# Patient Record
Sex: Female | Born: 1975 | Race: White | Hispanic: No | Marital: Married | Smoking: Never smoker
Health system: Southern US, Community
[De-identification: ages and names within clinical notes are randomized; demographics above are authoritative.]

## PROBLEM LIST (undated history)

## (undated) DIAGNOSIS — Z789 Other specified health status: Secondary | ICD-10-CM

## (undated) DIAGNOSIS — O09529 Supervision of elderly multigravida, unspecified trimester: Secondary | ICD-10-CM

## (undated) HISTORY — DX: Supervision of elderly multigravida, unspecified trimester: O09.529

## (undated) HISTORY — DX: Other specified health status: Z78.9

## (undated) HISTORY — PX: NO PAST SURGERIES: SHX2092

---

## 2001-11-20 ENCOUNTER — Other Ambulatory Visit: Admission: RE | Admit: 2001-11-20 | Discharge: 2001-11-20 | Payer: Self-pay | Admitting: Gynecology

## 2004-01-01 ENCOUNTER — Other Ambulatory Visit: Admission: RE | Admit: 2004-01-01 | Discharge: 2004-01-01 | Payer: Self-pay | Admitting: Gynecology

## 2004-10-19 ENCOUNTER — Other Ambulatory Visit: Admission: RE | Admit: 2004-10-19 | Discharge: 2004-10-19 | Payer: Self-pay | Admitting: Gynecology

## 2005-01-12 ENCOUNTER — Other Ambulatory Visit: Admission: RE | Admit: 2005-01-12 | Discharge: 2005-01-12 | Payer: Self-pay | Admitting: Gynecology

## 2005-09-14 ENCOUNTER — Other Ambulatory Visit: Admission: RE | Admit: 2005-09-14 | Discharge: 2005-09-14 | Payer: Self-pay | Admitting: Gynecology

## 2006-01-17 ENCOUNTER — Other Ambulatory Visit: Admission: RE | Admit: 2006-01-17 | Discharge: 2006-01-17 | Payer: Self-pay | Admitting: Gynecology

## 2006-04-25 ENCOUNTER — Ambulatory Visit (HOSPITAL_COMMUNITY): Admission: RE | Admit: 2006-04-25 | Discharge: 2006-04-25 | Payer: Self-pay | Admitting: Gynecology

## 2006-04-25 ENCOUNTER — Encounter (INDEPENDENT_AMBULATORY_CARE_PROVIDER_SITE_OTHER): Payer: Self-pay | Admitting: Specialist

## 2006-10-10 ENCOUNTER — Ambulatory Visit (HOSPITAL_COMMUNITY): Admission: RE | Admit: 2006-10-10 | Discharge: 2006-10-10 | Payer: Self-pay | Admitting: Obstetrics and Gynecology

## 2006-10-10 ENCOUNTER — Encounter (INDEPENDENT_AMBULATORY_CARE_PROVIDER_SITE_OTHER): Payer: Self-pay | Admitting: *Deleted

## 2007-10-06 ENCOUNTER — Inpatient Hospital Stay (HOSPITAL_COMMUNITY): Admission: AD | Admit: 2007-10-06 | Discharge: 2007-10-07 | Payer: Self-pay | Admitting: Obstetrics and Gynecology

## 2007-10-07 ENCOUNTER — Inpatient Hospital Stay (HOSPITAL_COMMUNITY): Admission: AD | Admit: 2007-10-07 | Discharge: 2007-10-10 | Payer: Self-pay | Admitting: Obstetrics & Gynecology

## 2009-02-06 ENCOUNTER — Ambulatory Visit (HOSPITAL_COMMUNITY): Admission: RE | Admit: 2009-02-06 | Discharge: 2009-02-06 | Payer: Self-pay | Admitting: Obstetrics and Gynecology

## 2009-07-07 ENCOUNTER — Inpatient Hospital Stay (HOSPITAL_COMMUNITY): Admission: RE | Admit: 2009-07-07 | Discharge: 2009-07-09 | Payer: Self-pay | Admitting: Obstetrics and Gynecology

## 2010-10-19 LAB — CBC
HCT: 34 % — ABNORMAL LOW (ref 36.0–46.0)
Hemoglobin: 11.5 g/dL — ABNORMAL LOW (ref 12.0–15.0)
Hemoglobin: 8.9 g/dL — ABNORMAL LOW (ref 12.0–15.0)
MCHC: 33.8 g/dL (ref 30.0–36.0)
MCHC: 34.2 g/dL (ref 30.0–36.0)
MCV: 93.8 fL (ref 78.0–100.0)
Platelets: 101 10*3/uL — ABNORMAL LOW (ref 150–400)
RBC: 2.36 MIL/uL — ABNORMAL LOW (ref 3.87–5.11)
RBC: 2.77 MIL/uL — ABNORMAL LOW (ref 3.87–5.11)
WBC: 5.6 10*3/uL (ref 4.0–10.5)

## 2010-12-04 NOTE — Consult Note (Signed)
NAMESEANA, Margaret Sutton            ACCOUNT NO.:  [id number]   MEDICAL RECORD NO.:  [id number]          PATIENT TYPE:  AMB   LOCATION:  SDC                           FACILITY:  [facility]   PHYSICIAN:  Juan H. Lily Peer, M.D.DATE OF BIRTH:  1976-07-17   DATE OF CONSULTATION:  04/25/2006  DATE OF DISCHARGE:                                   CONSULTATION   INDICATIONS FOR OPERATION:  A 35 year old gravida 2 para 0, AB1, with a  first-trimester first AB.   PREOPERATIVE DIAGNOSIS:  First-trimester missed abortion.   POSTOPERATIVE DIAGNOSIS:  First-trimester missed abortion.   ANESTHESIA:  MAC anesthesia and paracervical block.   FINDINGS:  Uterus approximately 8-10 weeks size, anteverted.  No palpable  adnexal masses.   DESCRIPTION OF OPERATION:  After the patient was adequately counseled, she  was taken to the operating room, where she underwent intravenous sedation.  Her legs were placed in a high lithotomy position.  The vagina and perineum  were prepped and draped in the usual sterile fashion.  A Foley catheter was  inserted in an effort to empty the bladder of its contents for approximately  50 cc.  The patient had received 1 g of cefoxitin for prophylaxis  preoperatively.  Her blood type is O positive.  Two percent Xylocaine with  1:100,000 epinephrine was inserted into the cervicovaginal stroma at the 2,  4, 8, and 10 o'clock positions for a total of 10 cc.  The cervix was dilated  to allow an 8 mm suction curette to be introduced to evacuate the uterus of  its contents, as well as with a Hunter's curette to completely evacuate the  uterus cavity of products of conception.  The single-tooth tenaculum was  removed.  The patient was awakened and transferred to the recovery room with  stable vital signs.  Blood loss was minimal, and fluid resuscitation  consisted of 1000 cc of lactated Ringer's.  She will receive 30 mg of  Toradol on arrival to the recovery room.      Juan H.  Lily Peer, M.D.  Electronically Signed     JHF/MEDQ  D:  04/25/2006  T:  04/25/2006  Job:  [id number]

## 2010-12-04 NOTE — H&P (Signed)
Margaret Sutton, Margaret Sutton            ACCOUNT NO.:  [id number]   MEDICAL RECORD NO.:  [id number]          PATIENT TYPE:  AMB   LOCATION:  SDC                           [facility]  [facility]   PHYSICIAN:  Juan H. Lily Peer, M.D.DATE OF BIRTH:  [birth year]   DATE OF ADMISSION:  04/25/2006  DATE OF DISCHARGE:                                HISTORY & PHYSICAL   CHIEF COMPLAINT:  First trimester missed abortion.   HISTORY OF PRESENT ILLNESS:  The patient is a 35 year old gravida 2, para 0,  abortion 1 who has been followed in the office secondary to a missed  abortion, based on uncertain last menstrual period.  She has not had any  bleeding or cramping.  Her quantitative beta HCG's had been followed closely  along with ultrasound and the ultrasound today continued to demonstrate that  based on her ultrasound measurements she would be six weeks and two days and  by maternal age by dates would be eight weeks and six days.  Still, there  was only evidence of a gestational sac but a questionable fetal pole and no  heart motion noted.  The growth of gestational sac, yolk sac was noted to be  eight weeks and two days.  No fetal heart seen with questionable fetal pole  at the edge of the sac, no change within the ovaries.  This is consistent  with a missed abortion.  Her quantitative beta HCG's were as follows.  On  October 2, the value was 116,[phone].  On October 4 it was 117,817.  The patient  was given the options to wait and see if she passes the products of  conception on her own or to proceed with a D&E.  They have offered to  proceed with a D&E after she was counseled in the office.  The patient  denies any allergies.  Denies any smoking or alcohol consumption.  No  previous surgery reported.   PHYSICAL EXAMINATION:  VITAL SIGNS:  The patient is approximately 138  pounds.  HEENT:  Unremarkable.  NECK:  Supple.  Trachea midline.  No carotid bruits, no thyromegaly.  LUNGS:  Clear to auscultation  without rhonchi or wheezes.  HEART:  Regular rate and rhythm.  No murmurs or gallops.  BREASTS:  Not done.  ABDOMEN:  Soft, nontender, without rebound or guarding.  PELVIC:  Vulva and urethra within normal limits.  Vaginal and cervix:  No  lesions or discharge.  Uterus approximately 8 weeks size.  No palpable  masses or tenderness.  RECTAL EXAM:  Not done.   ASSESSMENT:  A 35 year old gravida 2, para 0, abortion 1 with evidence of a  missed abortion.  The patient is scheduled to undergo a D&E at [facility] this afternoon.  The risks and benefits, pro's and con's were  discussed.  All questions were answered.  Will follow accordingly.   PLAN:  The patient is scheduled for D&E at 1 p.m. at [facility].      [place] H. Lily Peer, M.D.  Electronically Signed    JHF/MEDQ  D:  04/25/2006  T:  04/25/2006  Job:  [id number] 

## 2010-12-04 NOTE — Op Note (Signed)
Margaret Sutton, Margaret Sutton NO.:  [id number]   MEDICAL RECORD NO.:  [id number]          PATIENT TYPE:  AMB   LOCATION:  SDC                           [facility]ACILITY:  [facility]   PHYSICIAN:  Zelphia Cairo, MD    DATE OF BIRTH:  [birth year]   DATE OF PROCEDURE:  10/10/2006  DATE OF DISCHARGE:                               OPERATIVE REPORT   PREOPERATIVE DIAGNOSIS:  Missed abortion.   POSTOPERATIVE DIAGNOSIS:  Missed abortion.   PROCEDURE:  Suction D&E.   SURGEON:  Dr. Renaldo Fiddler   ASSISTANT:  None.   ANESTHESIA:  MAC.   SPECIMEN:  Products of conception.   ESTIMATED BLOOD LOSS:  50 mL.   COMPLICATIONS:  None.   CONDITION:  Stable to recovery room.   INDICATIONS:  The patient was seen in our office last week for a new OB  appointment at which time it was noted by ultrasound that she had twin  gestation with fetal demise of both fetuses.  She had a prior ultrasound  performed 3-4 weeks prior documenting a single intrauterine pregnancy  with cardiac motion.  Ultrasound results and options were discussed with  the patient.  The patient elected to proceed with suction D&E.  Risks,  benefits, and alternatives were discussed with the patient, and informed  consent was obtained.   PROCEDURE:  The patient was taken to the operating room where MAC  anesthesia was found to be adequate.  She was placed in the dorsal  lithotomy position using Allen stirrups.  She was prepped and draped in  sterile fashion, and her bladder was drained for approximately 75 mL of  clear urine.  A bivalve speculum was placed in the vagina, and a single-  tooth tenaculum was placed on the anterior lip of the cervix.  A  cervical block was performed using 1% Nesacaine.  The cervix was easily  dilated using Pratt dilators, and a 7-French suction catheter was used  to remove products of conception.  A gentle curetting was then performed  to ensure a uterine cry.  The suction catheter was  then reinserted  to remove all clots and debris.  The single-tooth  tenaculum was removed from the cervix.  Hemostasis was assured.  Speculum was removed from the vagina, and the patient was taken to the  recovery room in stable condition.      Zelphia Cairo, MD  Electronically Signed     GA/MEDQ  D:  10/10/2006  T:  10/10/2006  Job:  [id number]

## 2011-04-12 LAB — CBC
HCT: 33.3 — ABNORMAL LOW
Hemoglobin: 10.9 — ABNORMAL LOW
Hemoglobin: 8.3 — ABNORMAL LOW
MCHC: 35.2
MCV: 90.7
Platelets: 122 — ABNORMAL LOW
Platelets: 125 — ABNORMAL LOW
RBC: 2.55 — ABNORMAL LOW
RBC: 3.4 — ABNORMAL LOW
WBC: 6.6
WBC: 7.2
WBC: 7.5
WBC: 8.1

## 2011-04-12 LAB — FIBRINOGEN: Fibrinogen: 486 — ABNORMAL HIGH

## 2011-04-12 LAB — RPR: RPR Ser Ql: NONREACTIVE

## 2011-04-12 LAB — PLATELET COUNT: Platelets: 121 — ABNORMAL LOW

## 2011-07-30 ENCOUNTER — Ambulatory Visit: Payer: BC Managed Care – PPO

## 2011-07-30 DIAGNOSIS — J019 Acute sinusitis, unspecified: Secondary | ICD-10-CM

## 2015-02-21 ENCOUNTER — Other Ambulatory Visit: Payer: Self-pay | Admitting: Obstetrics and Gynecology

## 2015-02-24 LAB — CYTOLOGY - PAP

## 2016-07-21 LAB — OB RESULTS CONSOLE ANTIBODY SCREEN: Antibody Screen: NEGATIVE

## 2016-07-21 LAB — OB RESULTS CONSOLE RUBELLA ANTIBODY, IGM: RUBELLA: IMMUNE

## 2016-07-21 LAB — OB RESULTS CONSOLE ABO/RH: RH Type: POSITIVE

## 2016-07-21 LAB — OB RESULTS CONSOLE GC/CHLAMYDIA
CHLAMYDIA, DNA PROBE: NEGATIVE
GC PROBE AMP, GENITAL: NEGATIVE

## 2016-07-21 LAB — OB RESULTS CONSOLE HEPATITIS B SURFACE ANTIGEN: Hepatitis B Surface Ag: NEGATIVE

## 2016-07-21 LAB — OB RESULTS CONSOLE RPR: RPR: NONREACTIVE

## 2016-07-21 LAB — OB RESULTS CONSOLE HIV ANTIBODY (ROUTINE TESTING): HIV: NONREACTIVE

## 2017-02-10 ENCOUNTER — Telehealth (HOSPITAL_COMMUNITY): Payer: Self-pay | Admitting: *Deleted

## 2017-02-10 ENCOUNTER — Encounter (HOSPITAL_COMMUNITY): Payer: Self-pay | Admitting: *Deleted

## 2017-02-13 LAB — OB RESULTS CONSOLE GBS: STREP GROUP B AG: NEGATIVE

## 2017-02-14 ENCOUNTER — Encounter (HOSPITAL_COMMUNITY): Payer: Self-pay | Admitting: *Deleted

## 2017-02-14 NOTE — Telephone Encounter (Signed)
Preadmission screen  

## 2017-02-23 NOTE — H&P (Signed)
Margaret Sutton is a 41 y.o. female presenting for IOL.  Pt w/ h/o abruption during her first pregnancy - delivered at 35 wks.  This pregnancy uncomplicated.   OB History    Gravida Para Term Preterm AB Living   5 2 1 1 2 2    SAB TAB Ectopic Multiple Live Births   2       2     Past Medical History:  Diagnosis Date  . AMA (advanced maternal age) multigravida 35+   . Medical history non-contributory    Past Surgical History:  Procedure Laterality Date  . NO PAST SURGERIES     Family History: family history includes Prostate cancer in her father. Social History:  reports that she has never smoked. She has never used smokeless tobacco. She reports that she does not drink alcohol or use drugs.     Maternal Diabetes: No Genetic Screening: Normal Maternal Ultrasounds/Referrals: Normal Fetal Ultrasounds or other Referrals:  None Maternal Substance Abuse:  No Significant Maternal Medications:  None Significant Maternal Lab Results:  None Other Comments:  None  ROS History   Last menstrual period 05/26/2016. Exam Physical Exam  Prenatal labs: ABO, Rh: O/Positive/-- (01/03 0000) Antibody: Negative (01/03 0000) Rubella: Immune (01/03 0000) RPR: Nonreactive (01/03 0000)  HBsAg: Negative (01/03 0000)  HIV: Non-reactive (01/03 0000)  GBS:   negative  Assessment/Plan: Admit Pitocin/AROM Epidural prn   Margaret Sutton 02/23/2017, 1:08 PM

## 2017-02-24 ENCOUNTER — Inpatient Hospital Stay (HOSPITAL_COMMUNITY)
Admission: RE | Admit: 2017-02-24 | Discharge: 2017-02-26 | DRG: 775 | Disposition: A | Discharge status: Home / Self Care | Payer: BLUE CROSS/BLUE SHIELD | Source: Ambulatory Visit | Attending: Obstetrics and Gynecology | Admitting: Obstetrics and Gynecology

## 2017-02-24 ENCOUNTER — Inpatient Hospital Stay (HOSPITAL_COMMUNITY): Payer: BLUE CROSS/BLUE SHIELD | Admitting: Anesthesiology

## 2017-02-24 ENCOUNTER — Encounter (HOSPITAL_COMMUNITY): Payer: Self-pay

## 2017-02-24 VITALS — BP 105/51 | HR 79 | Temp 97.7°F | Resp 16 | Ht 66.0 in | Wt 195.0 lb

## 2017-02-24 DIAGNOSIS — Z3493 Encounter for supervision of normal pregnancy, unspecified, third trimester: Secondary | ICD-10-CM

## 2017-02-24 DIAGNOSIS — Z3A39 39 weeks gestation of pregnancy: Secondary | ICD-10-CM | POA: Diagnosis not present

## 2017-02-24 DIAGNOSIS — O9902 Anemia complicating childbirth: Principal | ICD-10-CM | POA: Diagnosis present

## 2017-02-24 DIAGNOSIS — O99214 Obesity complicating childbirth: Secondary | ICD-10-CM | POA: Diagnosis present

## 2017-02-24 DIAGNOSIS — O26893 Other specified pregnancy related conditions, third trimester: Secondary | ICD-10-CM | POA: Diagnosis present

## 2017-02-24 DIAGNOSIS — Z6831 Body mass index (BMI) 31.0-31.9, adult: Secondary | ICD-10-CM | POA: Diagnosis not present

## 2017-02-24 DIAGNOSIS — E669 Obesity, unspecified: Secondary | ICD-10-CM | POA: Diagnosis present

## 2017-02-24 DIAGNOSIS — Z3490 Encounter for supervision of normal pregnancy, unspecified, unspecified trimester: Secondary | ICD-10-CM

## 2017-02-24 DIAGNOSIS — D649 Anemia, unspecified: Secondary | ICD-10-CM | POA: Diagnosis present

## 2017-02-24 LAB — CBC
HCT: 35.8 % — ABNORMAL LOW (ref 36.0–46.0)
HEMATOCRIT: 34.1 % — AB (ref 36.0–46.0)
HEMOGLOBIN: 11.4 g/dL — AB (ref 12.0–15.0)
HEMOGLOBIN: 11.9 g/dL — AB (ref 12.0–15.0)
MCH: 30.2 pg (ref 26.0–34.0)
MCH: 30.1 pg (ref 26.0–34.0)
MCHC: 33.4 g/dL (ref 30.0–36.0)
MCHC: 33.2 g/dL (ref 30.0–36.0)
MCV: 90.2 fL (ref 78.0–100.0)
MCV: 90.6 fL (ref 78.0–100.0)
PLATELETS: 140 10*3/uL — AB (ref 150–400)
PLATELETS: 128 10*3/uL — AB (ref 150–400)
RBC: 3.78 MIL/uL — AB (ref 3.87–5.11)
RBC: 3.95 MIL/uL (ref 3.87–5.11)
RDW: 13.8 % (ref 11.5–15.5)
RDW: 13.7 % (ref 11.5–15.5)
WBC: 6.6 10*3/uL (ref 4.0–10.5)
WBC: 11.2 10*3/uL — ABNORMAL HIGH (ref 4.0–10.5)

## 2017-02-24 LAB — TYPE AND SCREEN
ABO/RH(D): O POS
Antibody Screen: NEGATIVE

## 2017-02-24 LAB — RPR: RPR Ser Ql: NONREACTIVE

## 2017-02-24 MED ORDER — SOD CITRATE-CITRIC ACID 500-334 MG/5ML PO SOLN: 30.0000 mL | ORAL | Status: DC | PRN

## 2017-02-24 MED ORDER — PHENYLEPHRINE 40 MCG/ML (10ML) SYRINGE FOR IV PUSH (FOR BLOOD PRESSURE SUPPORT)
80.0000 ug | PREFILLED_SYRINGE | INTRAVENOUS | Status: DC | PRN
  Filled 2017-02-24: qty 5

## 2017-02-24 MED ORDER — SENNOSIDES-DOCUSATE SODIUM 8.6-50 MG PO TABS
2.0000 | ORAL_TABLET | ORAL | Status: DC
  Administered 2017-02-24 – 2017-02-25 (×2): 2 via ORAL
  Filled 2017-02-24 (×2): qty 2

## 2017-02-24 MED ORDER — MEASLES, MUMPS & RUBELLA VAC ~~LOC~~ INJ: 0.5000 mL | INJECTION | Freq: Once | SUBCUTANEOUS | Status: DC

## 2017-02-24 MED ORDER — TETANUS-DIPHTH-ACELL PERTUSSIS 5-2.5-18.5 LF-MCG/0.5 IM SUSP: 0.5000 mL | Freq: Once | INTRAMUSCULAR | Status: DC

## 2017-02-24 MED ORDER — ONDANSETRON HCL 4 MG PO TABS: 4.0000 mg | ORAL_TABLET | ORAL | Status: DC | PRN

## 2017-02-24 MED ORDER — ONDANSETRON HCL 4 MG/2ML IJ SOLN: 4.0000 mg | Freq: Four times a day (QID) | INTRAMUSCULAR | Status: DC | PRN

## 2017-02-24 MED ORDER — LACTATED RINGERS IV SOLN
INTRAVENOUS | Status: DC
  Administered 2017-02-24 (×3): via INTRAVENOUS

## 2017-02-24 MED ORDER — METHYLERGONOVINE MALEATE 0.2 MG PO TABS
0.2000 mg | ORAL_TABLET | Freq: Three times a day (TID) | ORAL | Status: DC
  Administered 2017-02-24 – 2017-02-25 (×4): 0.2 mg via ORAL
  Filled 2017-02-24 (×4): qty 1

## 2017-02-24 MED ORDER — FENTANYL 2.5 MCG/ML BUPIVACAINE 1/10 % EPIDURAL INFUSION (WH - ANES)
14.0000 mL/h | INTRAMUSCULAR | Status: DC | PRN
  Administered 2017-02-24: 14 mL/h via EPIDURAL
  Filled 2017-02-24: qty 100

## 2017-02-24 MED ORDER — ONDANSETRON HCL 4 MG/2ML IJ SOLN: 4.0000 mg | INTRAMUSCULAR | Status: DC | PRN

## 2017-02-24 MED ORDER — OXYTOCIN 40 UNITS IN LACTATED RINGERS INFUSION - SIMPLE MED: 2.5000 [IU]/h | INTRAVENOUS | Status: DC

## 2017-02-24 MED ORDER — DIPHENHYDRAMINE HCL 25 MG PO CAPS: 25.0000 mg | ORAL_CAPSULE | Freq: Four times a day (QID) | ORAL | Status: DC | PRN

## 2017-02-24 MED ORDER — ACETAMINOPHEN 325 MG PO TABS
650.0000 mg | ORAL_TABLET | ORAL | Status: DC | PRN
  Administered 2017-02-25: 650 mg via ORAL
  Filled 2017-02-24: qty 2

## 2017-02-24 MED ORDER — SIMETHICONE 80 MG PO CHEW: 80.0000 mg | CHEWABLE_TABLET | ORAL | Status: DC | PRN

## 2017-02-24 MED ORDER — EPHEDRINE 5 MG/ML INJ
10.0000 mg | INTRAVENOUS | Status: DC | PRN
  Filled 2017-02-24: qty 2

## 2017-02-24 MED ORDER — LIDOCAINE HCL (PF) 1 % IJ SOLN
30.0000 mL | INTRAMUSCULAR | Status: DC | PRN
  Filled 2017-02-24: qty 30

## 2017-02-24 MED ORDER — PRENATAL MULTIVITAMIN CH
1.0000 | ORAL_TABLET | Freq: Every day | ORAL | Status: DC
  Administered 2017-02-25: 1 via ORAL
  Filled 2017-02-24: qty 1

## 2017-02-24 MED ORDER — OXYCODONE-ACETAMINOPHEN 5-325 MG PO TABS: 1.0000 | ORAL_TABLET | ORAL | Status: DC | PRN

## 2017-02-24 MED ORDER — MEDROXYPROGESTERONE ACETATE 150 MG/ML IM SUSP: 150.0000 mg | INTRAMUSCULAR | Status: DC | PRN

## 2017-02-24 MED ORDER — ZOLPIDEM TARTRATE 5 MG PO TABS: 5.0000 mg | ORAL_TABLET | Freq: Every evening | ORAL | Status: DC | PRN

## 2017-02-24 MED ORDER — LACTATED RINGERS IV SOLN: 500.0000 mL | INTRAVENOUS | Status: DC | PRN

## 2017-02-24 MED ORDER — LIDOCAINE HCL (PF) 1 % IJ SOLN
INTRAMUSCULAR | Status: DC | PRN
  Administered 2017-02-24: 2 mL via EPIDURAL
  Administered 2017-02-24: 3 mL via EPIDURAL
  Administered 2017-02-24: 5 mL via EPIDURAL

## 2017-02-24 MED ORDER — OXYCODONE-ACETAMINOPHEN 5-325 MG PO TABS: 2.0000 | ORAL_TABLET | ORAL | Status: DC | PRN

## 2017-02-24 MED ORDER — WITCH HAZEL-GLYCERIN EX PADS: 1.0000 "application " | MEDICATED_PAD | CUTANEOUS | Status: DC | PRN

## 2017-02-24 MED ORDER — IBUPROFEN 600 MG PO TABS
600.0000 mg | ORAL_TABLET | Freq: Four times a day (QID) | ORAL | Status: DC
  Administered 2017-02-24 – 2017-02-26 (×7): 600 mg via ORAL
  Filled 2017-02-24 (×7): qty 1

## 2017-02-24 MED ORDER — LACTATED RINGERS IV SOLN: 500.0000 mL | Freq: Once | INTRAVENOUS | Status: DC

## 2017-02-24 MED ORDER — OXYTOCIN BOLUS FROM INFUSION: 500.0000 mL | Freq: Once | INTRAVENOUS | Status: DC

## 2017-02-24 MED ORDER — ACETAMINOPHEN 325 MG PO TABS
650.0000 mg | ORAL_TABLET | ORAL | Status: DC | PRN
  Administered 2017-02-24: 650 mg via ORAL
  Filled 2017-02-24: qty 2

## 2017-02-24 MED ORDER — OXYTOCIN 40 UNITS IN LACTATED RINGERS INFUSION - SIMPLE MED
1.0000 m[IU]/min | INTRAVENOUS | Status: DC
  Administered 2017-02-24: 2 m[IU]/min via INTRAVENOUS
  Filled 2017-02-24: qty 1000

## 2017-02-24 MED ORDER — SODIUM CHLORIDE 0.9 % IV SOLN
2.0000 g | Freq: Four times a day (QID) | INTRAVENOUS | Status: AC
  Administered 2017-02-24 – 2017-02-25 (×3): 2 g via INTRAVENOUS
  Filled 2017-02-24 (×3): qty 2000

## 2017-02-24 MED ORDER — DIBUCAINE 1 % RE OINT: 1.0000 "application " | TOPICAL_OINTMENT | RECTAL | Status: DC | PRN

## 2017-02-24 MED ORDER — METHYLERGONOVINE MALEATE 0.2 MG/ML IJ SOLN
INTRAMUSCULAR | Status: AC
  Filled 2017-02-24: qty 1

## 2017-02-24 MED ORDER — COCONUT OIL OIL: 1.0000 "application " | TOPICAL_OIL | Status: DC | PRN

## 2017-02-24 MED ORDER — DIPHENHYDRAMINE HCL 50 MG/ML IJ SOLN: 12.5000 mg | INTRAMUSCULAR | Status: DC | PRN

## 2017-02-24 MED ORDER — PHENYLEPHRINE 40 MCG/ML (10ML) SYRINGE FOR IV PUSH (FOR BLOOD PRESSURE SUPPORT)
80.0000 ug | PREFILLED_SYRINGE | INTRAVENOUS | Status: DC | PRN
  Filled 2017-02-24: qty 10
  Filled 2017-02-24: qty 5

## 2017-02-24 MED ORDER — BENZOCAINE-MENTHOL 20-0.5 % EX AERO
1.0000 "application " | INHALATION_SPRAY | CUTANEOUS | Status: DC | PRN
  Administered 2017-02-24: 1 via TOPICAL
  Filled 2017-02-24: qty 56

## 2017-02-24 MED ORDER — METHYLERGONOVINE MALEATE 0.2 MG/ML IJ SOLN
0.2000 mg | Freq: Once | INTRAMUSCULAR | Status: AC
  Administered 2017-02-24: 0.2 mg via INTRAMUSCULAR

## 2017-02-24 MED ORDER — TERBUTALINE SULFATE 1 MG/ML IJ SOLN
0.2500 mg | Freq: Once | INTRAMUSCULAR | Status: DC | PRN
Start: 2017-02-24 — End: 2017-02-24
  Filled 2017-02-24: qty 1

## 2017-02-24 NOTE — Progress Notes (Signed)
SVD of vigorous female infant w/ apgars of 8,9.  Umbilical cord severed when mom and RN pulled baby to chest and quickly clamped. Placenta was tightly adherent to right fundus and manually removed in segments Pt tolerated well and with the final sweep of uterus, I did not palpate any retained POC 2nd degree lac repaired w/ 3-0 vicryl rapide. Methergine given Fundus firm.  EBL 300cc .

## 2017-02-24 NOTE — Progress Notes (Signed)
Pt comfortable w/ epidural  FHT cat 1 Toco Q2 Cvx 4-5 per last RN exam  A/P:  Exp mngt

## 2017-02-24 NOTE — Anesthesia Preprocedure Evaluation (Signed)
Anesthesia Evaluation  Patient identified by MRN, date of birth, ID band Patient awake    Reviewed: Allergy & Precautions, NPO status , Patient's Chart, lab work & pertinent test results  Airway Mallampati: II  TM Distance: >3 FB Neck ROM: Full    Dental  (+) Teeth Intact, Dental Advisory Given   Pulmonary neg pulmonary ROS,    Pulmonary exam normal breath sounds clear to auscultation       Cardiovascular negative cardio ROS Normal cardiovascular exam Rhythm:Regular Rate:Normal     Neuro/Psych negative neurological ROS  negative psych ROS   GI/Hepatic negative GI ROS, Neg liver ROS,   Endo/Other  Obesity   Renal/GU negative Renal ROS     Musculoskeletal negative musculoskeletal ROS (+)   Abdominal   Peds  Hematology  (+) Blood dyscrasia, anemia , thrombocytopenia--plt 140k   Anesthesia Other Findings Day of surgery medications reviewed with the patient.  Reproductive/Obstetrics Advanced maternal age                             Anesthesia Physical Anesthesia Plan  ASA: II  Anesthesia Plan: Epidural   Post-op Pain Management:    Induction:   PONV Risk Score and Plan: Treatment may vary due to age or medical condition  Airway Management Planned:   Additional Equipment:   Intra-op Plan:   Post-operative Plan:   Informed Consent: I have reviewed the patients History and Physical, chart, labs and discussed the procedure including the risks, benefits and alternatives for the proposed anesthesia with the patient or authorized representative who has indicated his/her understanding and acceptance.   Dental advisory given  Plan Discussed with:   Anesthesia Plan Comments: (Patient identified. Risks/Benefits/Options discussed with patient including but not limited to bleeding, infection, nerve damage, paralysis, failed block, incomplete pain control, headache, blood pressure changes,  nausea, vomiting, reactions to medication both or allergic, itching and postpartum back pain. Confirmed with bedside nurse the patient's most recent platelet count. Confirmed with patient that they are not currently taking any anticoagulation, have any bleeding history or any family history of bleeding disorders. Patient expressed understanding and wished to proceed. All questions were answered. )        Anesthesia Quick Evaluation

## 2017-02-24 NOTE — Anesthesia Pain Management Evaluation Note (Signed)
  CRNA Pain Management Visit Note  Patient: Margaret Sutton, 41 y.o., female  "Hello I am a [facility] of the anesthesia team at [facility]. We have an anesthesia team available at all times to provide care throughout the hospital, including epidural management and anesthesia for C-section. I don't know your plan for the delivery whether it a natural birth, water birth, IV sedation, nitrous supplementation, doula or epidural, but we want to meet your pain goals."   1.Was your pain managed to your expectations on prior hospitalizations?   Yes   2.What is your expectation for pain management during this hospitalization?     Epidural  3.How can we help you reach that goal? Epidural when desired.  Record the patient's initial score and the patient's pain goal.   Pain: 0  Pain Goal: 5 [facility] wants you to be able to say your pain was always managed very well.  Chloee Tena 02/24/2017

## 2017-02-24 NOTE — Anesthesia Procedure Notes (Signed)
Epidural Patient location during procedure: OB Start time: 02/24/2017 10:54 AM End time: 02/24/2017 10:59 AM  Staffing Anesthesiologist: Cecile HearingURK, Lonn Im EDWARD Performed: anesthesiologist   Preanesthetic Checklist Completed: patient identified, pre-op evaluation, timeout performed, IV checked, risks and benefits discussed and monitors and equipment checked  Epidural Patient position: sitting Prep: DuraPrep Patient monitoring: blood pressure and continuous pulse ox Approach: midline Location: L3-L4 Injection technique: LOR air  Needle:  Needle type: Tuohy  Needle gauge: 17 G Needle length: 9 cm Needle insertion depth: 5 cm Catheter size: 19 Gauge Catheter at skin depth: 10 cm Test dose: negative and Other (1% Lidocaine)  Additional Notes Patient identified.  Risk benefits discussed including failed block, incomplete pain control, headache, nerve damage, paralysis, blood pressure changes, nausea, vomiting, reactions to medication both toxic or allergic, and postpartum back pain.  Patient expressed understanding and wished to proceed.  All questions were answered.  Sterile technique used throughout procedure and epidural site dressed with sterile barrier dressing. No paresthesia or other complications noted. The patient did not experience any signs of intravascular injection such as tinnitus or metallic taste in mouth nor signs of intrathecal spread such as rapid motor block. Please see nursing notes for vital signs. Reason for block:procedure for pain

## 2017-02-24 NOTE — Lactation Note (Signed)
This note was copied from a baby's chart. Lactation Consultation Note  Patient Name: Boy Melody Havershley Stailey WUJWJ'XToday's Date: 02/24/2017 Reason for consult: Initial assessment   Baby 4 hours old.  P3.  Mother states she bf and pumped for a few weeks and gave up because she doubted her milk supply. She is happy baby latched for approx 40 min after birth.  Baby is sleeping. Offered to unwrap baby and assist w/ feeding but parents did not want to wake baby at this time. Recommend waking baby within the next hour, place baby STS and attempt feeding. Discussed hand expression and spoon feeding, supply and demand. Mom encouraged to feed baby 8-12 times/24 hours and with feeding cues.  Mom made aware of O/P services, breastfeeding support groups, community resources, and our phone # for post-discharge questions.     Maternal Data Has patient been taught Hand Expression?: Yes (per mom) Does the patient have breastfeeding experience prior to this delivery?: Yes  Feeding    LATCH Score                   Interventions    Lactation Tools Discussed/Used     Consult Status Consult Status: Follow-up Date: 02/25/17 Follow-up type: In-patient    Dahlia ByesBerkelhammer, Valory Wetherby [facility] 02/24/2017, 9:39 PM

## 2017-02-24 NOTE — Progress Notes (Signed)
Pt w/out complaints.  No ctx or vb.  AF, VSS Abd - gravid, NT  EFW 7.5# Ext - NT Cvx 2/50/-2 AROM - clear  A/P:  Start pitocin Exp mngt Epidural prn

## 2017-02-24 NOTE — Progress Notes (Signed)
5 big sponges 5 small sponges 2 injectables 10 instruments

## 2017-02-24 NOTE — Anesthesia Postprocedure Evaluation (Signed)
Anesthesia Post Note  Patient: Margaret Sutton  Procedure(s) Performed: * No procedures listed *     Patient location during evaluation: Mother Baby Anesthesia Type: Epidural Level of consciousness: awake and alert and oriented Pain management: satisfactory to patient Vital Signs Assessment: post-procedure vital signs reviewed and stable Respiratory status: spontaneous breathing and nonlabored ventilation Cardiovascular status: stable Postop Assessment: no headache, no backache, no signs of nausea or vomiting, adequate PO intake and patient able to bend at knees (patient up walking) Anesthetic complications: no    Last Vitals:  Vitals:   02/24/17 1800 02/24/17 1900  BP: (!) 115/59 123/68  Pulse: 71 81  Resp: 16 18  Temp: 37 C 36.7 C  SpO2:      Last Pain:  Vitals:   02/24/17 1900  TempSrc: Oral   Pain Goal:                 Madison HickmanGREGORY,Leslie Langille

## 2017-02-25 LAB — CBC
HCT: 32.3 % — ABNORMAL LOW (ref 36.0–46.0)
Hemoglobin: 10.8 g/dL — ABNORMAL LOW (ref 12.0–15.0)
MCH: 30.4 pg (ref 26.0–34.0)
MCHC: 33.4 g/dL (ref 30.0–36.0)
MCV: 91 fL (ref 78.0–100.0)
PLATELETS: 127 10*3/uL — AB (ref 150–400)
RBC: 3.55 MIL/uL — AB (ref 3.87–5.11)
RDW: 13.9 % (ref 11.5–15.5)
WBC: 8.3 10*3/uL (ref 4.0–10.5)

## 2017-02-25 NOTE — Progress Notes (Signed)
Post Partum Day 1 Subjective: no complaints, up ad lib, voiding, tolerating PO and + flatus  Objective: Blood pressure (!) 111/59, pulse 66, temperature 97.9 F (36.6 C), temperature source Oral, resp. rate 16, height 5\' 6"  (1.676 m), weight 195 lb (88.5 kg), last menstrual period 05/26/2016, SpO2 100 %, unknown if currently breastfeeding.  Physical Exam:  General: alert, cooperative and no distress Lochia: appropriate Uterine Fundus: firm Incision: healing well DVT Evaluation: No evidence of DVT seen on physical exam.   Recent Labs  02/24/17 1642 02/25/17 0548  HGB 11.9* 10.8*  HCT 35.8* 32.3*    Assessment/Plan: Plan for discharge tomorrow   LOS: 1 day   Adrianne Shackleton II,Rami Waddle E 02/25/2017, 8:24 AM

## 2017-02-26 MED ORDER — IBUPROFEN 600 MG PO TABS: 600.0000 mg | ORAL_TABLET | Freq: Four times a day (QID) | ORAL | 0 refills | Status: AC | PRN

## 2017-02-26 NOTE — Discharge Summary (Signed)
Obstetric Discharge Summary Reason for Admission: induction of labor Prenatal Procedures: none Intrapartum Procedures: spontaneous vaginal delivery Postpartum Procedures: none Complications-Operative and Postpartum: none Hemoglobin  Date Value Ref Range Status  02/25/2017 10.8 (L) 12.0 - 15.0 g/dL Final   HCT  Date Value Ref Range Status  02/25/2017 32.3 (L) 36.0 - 46.0 % Final    Physical Exam:  General: alert, cooperative and no distress Lochia: appropriate Uterine Fundus: firm Incision: healing well DVT Evaluation: No evidence of DVT seen on physical exam.  Discharge Diagnoses: Term Pregnancy-delivered  Discharge Information: Date: 02/26/2017 Activity: pelvic rest Diet: routine Medications: PNV and Ibuprofen Condition: stable Instructions: refer to practice specific booklet Discharge to: home   Newborn Data: Live born female  Birth Weight: 7 lb 13 oz (3545 g) APGAR: 8, 9  Home with mother.  Margaret Sutton,Margaret Sutton E 02/26/2017, 8:00 AM

## 2017-02-26 NOTE — Lactation Note (Signed)
This note was copied from a baby's chart. Lactation Consultation Note  Patient Name: Margaret Sutton WUJWJ'XToday's Date: 02/26/2017  Mom states baby was frustrated last night and would not sustain latch so she started supplementing with alimentum.  Discussed milk coming to volume.  Reviewed supplementation parameters.  Mom has a pump at home.  Instructed to pump breasts anytime baby receives supplement.  Reviewed OP services and support and encouraged to call prn.   Maternal Data    Feeding    LATCH Score                   Interventions    Lactation Tools Discussed/Used     Consult Status      Huston FoleyMOULDEN, Nycere Presley S 02/26/2017, 9:09 AM

## 2019-08-09 DIAGNOSIS — U071 COVID-19: Secondary | ICD-10-CM | POA: Diagnosis not present

## 2020-03-13 DIAGNOSIS — F331 Major depressive disorder, recurrent, moderate: Secondary | ICD-10-CM | POA: Diagnosis not present

## 2020-03-13 DIAGNOSIS — F411 Generalized anxiety disorder: Secondary | ICD-10-CM | POA: Diagnosis not present

## 2020-03-13 DIAGNOSIS — G4700 Insomnia, unspecified: Secondary | ICD-10-CM | POA: Diagnosis not present

## 2020-04-22 DIAGNOSIS — Z0000 Encounter for general adult medical examination without abnormal findings: Secondary | ICD-10-CM | POA: Diagnosis not present

## 2020-04-22 DIAGNOSIS — Z1159 Encounter for screening for other viral diseases: Secondary | ICD-10-CM | POA: Diagnosis not present

## 2020-04-24 DIAGNOSIS — F331 Major depressive disorder, recurrent, moderate: Secondary | ICD-10-CM | POA: Diagnosis not present

## 2020-04-24 DIAGNOSIS — Z7185 Encounter for immunization safety counseling: Secondary | ICD-10-CM | POA: Diagnosis not present

## 2020-04-24 DIAGNOSIS — G4700 Insomnia, unspecified: Secondary | ICD-10-CM | POA: Diagnosis not present

## 2020-04-24 DIAGNOSIS — F411 Generalized anxiety disorder: Secondary | ICD-10-CM | POA: Diagnosis not present

## 2020-05-07 DIAGNOSIS — Z0000 Encounter for general adult medical examination without abnormal findings: Secondary | ICD-10-CM | POA: Diagnosis not present

## 2020-05-07 DIAGNOSIS — Z23 Encounter for immunization: Secondary | ICD-10-CM | POA: Diagnosis not present

## 2020-05-21 DIAGNOSIS — Z1231 Encounter for screening mammogram for malignant neoplasm of breast: Secondary | ICD-10-CM | POA: Diagnosis not present

## 2020-05-21 DIAGNOSIS — Z6831 Body mass index (BMI) 31.0-31.9, adult: Secondary | ICD-10-CM | POA: Diagnosis not present

## 2020-05-21 DIAGNOSIS — Z01419 Encounter for gynecological examination (general) (routine) without abnormal findings: Secondary | ICD-10-CM | POA: Diagnosis not present

## 2020-05-27 ENCOUNTER — Other Ambulatory Visit: Payer: Self-pay | Admitting: Obstetrics and Gynecology

## 2020-05-27 DIAGNOSIS — R928 Other abnormal and inconclusive findings on diagnostic imaging of breast: Secondary | ICD-10-CM

## 2020-06-06 ENCOUNTER — Ambulatory Visit
Admission: RE | Admit: 2020-06-06 | Discharge: 2020-06-06 | Disposition: A | Discharge status: Home / Self Care | Payer: BLUE CROSS/BLUE SHIELD | Source: Ambulatory Visit | Attending: Obstetrics and Gynecology | Admitting: Obstetrics and Gynecology

## 2020-06-06 ENCOUNTER — Ambulatory Visit
Admission: RE | Admit: 2020-06-06 | Discharge: 2020-06-06 | Disposition: A | Discharge status: Home / Self Care | Payer: BC Managed Care – PPO | Source: Ambulatory Visit | Attending: Obstetrics and Gynecology | Admitting: Obstetrics and Gynecology

## 2020-06-06 ENCOUNTER — Other Ambulatory Visit: Payer: Self-pay | Admitting: Obstetrics and Gynecology

## 2020-06-06 ENCOUNTER — Other Ambulatory Visit: Payer: Self-pay

## 2020-06-06 DIAGNOSIS — R922 Inconclusive mammogram: Secondary | ICD-10-CM | POA: Diagnosis not present

## 2020-06-06 DIAGNOSIS — R928 Other abnormal and inconclusive findings on diagnostic imaging of breast: Secondary | ICD-10-CM

## 2020-06-06 DIAGNOSIS — N6489 Other specified disorders of breast: Secondary | ICD-10-CM | POA: Diagnosis not present

## 2020-06-19 ENCOUNTER — Other Ambulatory Visit: Payer: Self-pay

## 2020-06-19 ENCOUNTER — Ambulatory Visit
Admission: RE | Admit: 2020-06-19 | Discharge: 2020-06-19 | Disposition: A | Discharge status: Home / Self Care | Payer: BC Managed Care – PPO | Source: Ambulatory Visit | Attending: Obstetrics and Gynecology | Admitting: Obstetrics and Gynecology

## 2020-06-19 DIAGNOSIS — N6312 Unspecified lump in the right breast, upper inner quadrant: Secondary | ICD-10-CM | POA: Diagnosis not present

## 2020-06-19 DIAGNOSIS — N62 Hypertrophy of breast: Secondary | ICD-10-CM | POA: Diagnosis not present

## 2020-06-19 DIAGNOSIS — R928 Other abnormal and inconclusive findings on diagnostic imaging of breast: Secondary | ICD-10-CM

## 2020-07-30 DIAGNOSIS — Z1152 Encounter for screening for COVID-19: Secondary | ICD-10-CM | POA: Diagnosis not present

## 2020-11-10 DIAGNOSIS — Z23 Encounter for immunization: Secondary | ICD-10-CM | POA: Diagnosis not present

## 2020-11-25 DIAGNOSIS — J019 Acute sinusitis, unspecified: Secondary | ICD-10-CM | POA: Diagnosis not present

## 2021-05-05 DIAGNOSIS — M62838 Other muscle spasm: Secondary | ICD-10-CM | POA: Diagnosis not present

## 2021-05-05 DIAGNOSIS — N393 Stress incontinence (female) (male): Secondary | ICD-10-CM | POA: Diagnosis not present

## 2021-05-05 DIAGNOSIS — M6281 Muscle weakness (generalized): Secondary | ICD-10-CM | POA: Diagnosis not present

## 2021-05-05 DIAGNOSIS — K5900 Constipation, unspecified: Secondary | ICD-10-CM | POA: Diagnosis not present

## 2021-05-05 DIAGNOSIS — R197 Diarrhea, unspecified: Secondary | ICD-10-CM | POA: Diagnosis not present

## 2021-05-05 DIAGNOSIS — R3915 Urgency of urination: Secondary | ICD-10-CM | POA: Diagnosis not present

## 2021-05-19 DIAGNOSIS — R3915 Urgency of urination: Secondary | ICD-10-CM | POA: Diagnosis not present

## 2021-05-19 DIAGNOSIS — M6281 Muscle weakness (generalized): Secondary | ICD-10-CM | POA: Diagnosis not present

## 2021-05-19 DIAGNOSIS — N393 Stress incontinence (female) (male): Secondary | ICD-10-CM | POA: Diagnosis not present

## 2021-05-19 DIAGNOSIS — R197 Diarrhea, unspecified: Secondary | ICD-10-CM | POA: Diagnosis not present

## 2021-05-19 DIAGNOSIS — K5900 Constipation, unspecified: Secondary | ICD-10-CM | POA: Diagnosis not present

## 2021-05-19 DIAGNOSIS — M62838 Other muscle spasm: Secondary | ICD-10-CM | POA: Diagnosis not present

## 2021-05-26 DIAGNOSIS — R197 Diarrhea, unspecified: Secondary | ICD-10-CM | POA: Diagnosis not present

## 2021-05-26 DIAGNOSIS — M62838 Other muscle spasm: Secondary | ICD-10-CM | POA: Diagnosis not present

## 2021-05-26 DIAGNOSIS — K5900 Constipation, unspecified: Secondary | ICD-10-CM | POA: Diagnosis not present

## 2021-05-26 DIAGNOSIS — N393 Stress incontinence (female) (male): Secondary | ICD-10-CM | POA: Diagnosis not present

## 2021-05-26 DIAGNOSIS — R3915 Urgency of urination: Secondary | ICD-10-CM | POA: Diagnosis not present

## 2021-05-26 DIAGNOSIS — M6281 Muscle weakness (generalized): Secondary | ICD-10-CM | POA: Diagnosis not present

## 2021-06-02 DIAGNOSIS — R3915 Urgency of urination: Secondary | ICD-10-CM | POA: Diagnosis not present

## 2021-06-02 DIAGNOSIS — R197 Diarrhea, unspecified: Secondary | ICD-10-CM | POA: Diagnosis not present

## 2021-06-02 DIAGNOSIS — M6281 Muscle weakness (generalized): Secondary | ICD-10-CM | POA: Diagnosis not present

## 2021-06-02 DIAGNOSIS — K5900 Constipation, unspecified: Secondary | ICD-10-CM | POA: Diagnosis not present

## 2021-06-02 DIAGNOSIS — M62838 Other muscle spasm: Secondary | ICD-10-CM | POA: Diagnosis not present

## 2021-06-02 DIAGNOSIS — N393 Stress incontinence (female) (male): Secondary | ICD-10-CM | POA: Diagnosis not present

## 2021-06-09 DIAGNOSIS — N393 Stress incontinence (female) (male): Secondary | ICD-10-CM | POA: Diagnosis not present

## 2021-06-09 DIAGNOSIS — R197 Diarrhea, unspecified: Secondary | ICD-10-CM | POA: Diagnosis not present

## 2021-06-09 DIAGNOSIS — K5900 Constipation, unspecified: Secondary | ICD-10-CM | POA: Diagnosis not present

## 2021-06-09 DIAGNOSIS — M6281 Muscle weakness (generalized): Secondary | ICD-10-CM | POA: Diagnosis not present

## 2021-06-09 DIAGNOSIS — M62838 Other muscle spasm: Secondary | ICD-10-CM | POA: Diagnosis not present

## 2021-06-09 DIAGNOSIS — R3915 Urgency of urination: Secondary | ICD-10-CM | POA: Diagnosis not present

## 2021-06-16 DIAGNOSIS — F411 Generalized anxiety disorder: Secondary | ICD-10-CM | POA: Diagnosis not present

## 2021-06-16 DIAGNOSIS — K5900 Constipation, unspecified: Secondary | ICD-10-CM | POA: Diagnosis not present

## 2021-06-16 DIAGNOSIS — N393 Stress incontinence (female) (male): Secondary | ICD-10-CM | POA: Diagnosis not present

## 2021-06-16 DIAGNOSIS — R197 Diarrhea, unspecified: Secondary | ICD-10-CM | POA: Diagnosis not present

## 2021-06-16 DIAGNOSIS — M62838 Other muscle spasm: Secondary | ICD-10-CM | POA: Diagnosis not present

## 2021-06-16 DIAGNOSIS — G4700 Insomnia, unspecified: Secondary | ICD-10-CM | POA: Diagnosis not present

## 2021-06-16 DIAGNOSIS — M6281 Muscle weakness (generalized): Secondary | ICD-10-CM | POA: Diagnosis not present

## 2021-06-16 DIAGNOSIS — F331 Major depressive disorder, recurrent, moderate: Secondary | ICD-10-CM | POA: Diagnosis not present

## 2021-06-16 DIAGNOSIS — R3915 Urgency of urination: Secondary | ICD-10-CM | POA: Diagnosis not present

## 2021-06-23 DIAGNOSIS — K5900 Constipation, unspecified: Secondary | ICD-10-CM | POA: Diagnosis not present

## 2021-06-23 DIAGNOSIS — N393 Stress incontinence (female) (male): Secondary | ICD-10-CM | POA: Diagnosis not present

## 2021-06-23 DIAGNOSIS — M62838 Other muscle spasm: Secondary | ICD-10-CM | POA: Diagnosis not present

## 2021-06-23 DIAGNOSIS — M6281 Muscle weakness (generalized): Secondary | ICD-10-CM | POA: Diagnosis not present

## 2021-06-30 DIAGNOSIS — M6281 Muscle weakness (generalized): Secondary | ICD-10-CM | POA: Diagnosis not present

## 2021-06-30 DIAGNOSIS — N393 Stress incontinence (female) (male): Secondary | ICD-10-CM | POA: Diagnosis not present

## 2021-06-30 DIAGNOSIS — K5900 Constipation, unspecified: Secondary | ICD-10-CM | POA: Diagnosis not present

## 2021-06-30 DIAGNOSIS — M62838 Other muscle spasm: Secondary | ICD-10-CM | POA: Diagnosis not present

## 2021-07-07 DIAGNOSIS — K5900 Constipation, unspecified: Secondary | ICD-10-CM | POA: Diagnosis not present

## 2021-07-07 DIAGNOSIS — N393 Stress incontinence (female) (male): Secondary | ICD-10-CM | POA: Diagnosis not present

## 2021-07-07 DIAGNOSIS — M6281 Muscle weakness (generalized): Secondary | ICD-10-CM | POA: Diagnosis not present

## 2021-07-07 DIAGNOSIS — M62838 Other muscle spasm: Secondary | ICD-10-CM | POA: Diagnosis not present

## 2021-07-22 DIAGNOSIS — Z1329 Encounter for screening for other suspected endocrine disorder: Secondary | ICD-10-CM | POA: Diagnosis not present

## 2021-07-22 DIAGNOSIS — E785 Hyperlipidemia, unspecified: Secondary | ICD-10-CM | POA: Diagnosis not present

## 2021-07-22 DIAGNOSIS — Z131 Encounter for screening for diabetes mellitus: Secondary | ICD-10-CM | POA: Diagnosis not present

## 2021-07-22 DIAGNOSIS — Z1231 Encounter for screening mammogram for malignant neoplasm of breast: Secondary | ICD-10-CM | POA: Diagnosis not present

## 2021-07-22 DIAGNOSIS — Z6829 Body mass index (BMI) 29.0-29.9, adult: Secondary | ICD-10-CM | POA: Diagnosis not present

## 2021-07-22 DIAGNOSIS — Z01419 Encounter for gynecological examination (general) (routine) without abnormal findings: Secondary | ICD-10-CM | POA: Diagnosis not present

## 2021-07-30 DIAGNOSIS — E782 Mixed hyperlipidemia: Secondary | ICD-10-CM | POA: Diagnosis not present

## 2021-07-30 DIAGNOSIS — F411 Generalized anxiety disorder: Secondary | ICD-10-CM | POA: Diagnosis not present

## 2021-07-30 DIAGNOSIS — F331 Major depressive disorder, recurrent, moderate: Secondary | ICD-10-CM | POA: Diagnosis not present

## 2021-07-30 DIAGNOSIS — G4700 Insomnia, unspecified: Secondary | ICD-10-CM | POA: Diagnosis not present

## 2021-08-25 DIAGNOSIS — M2022 Hallux rigidus, left foot: Secondary | ICD-10-CM | POA: Diagnosis not present

## 2021-09-03 DIAGNOSIS — E559 Vitamin D deficiency, unspecified: Secondary | ICD-10-CM | POA: Diagnosis not present

## 2021-09-16 DIAGNOSIS — F331 Major depressive disorder, recurrent, moderate: Secondary | ICD-10-CM | POA: Diagnosis not present

## 2021-09-16 DIAGNOSIS — G4700 Insomnia, unspecified: Secondary | ICD-10-CM | POA: Diagnosis not present

## 2021-09-16 DIAGNOSIS — F411 Generalized anxiety disorder: Secondary | ICD-10-CM | POA: Diagnosis not present

## 2021-09-16 DIAGNOSIS — E559 Vitamin D deficiency, unspecified: Secondary | ICD-10-CM | POA: Diagnosis not present

## 2022-03-08 IMAGING — US US BREAST*L* LIMITED INC AXILLA
1 series · 5 of 5 positions shown · non-contrast
Comparison: Previous exam(s).

CLINICAL DATA: Patient was recalled from screening mammogram for
possible asymmetries in both breast.

EXAM:
DIGITAL DIAGNOSTIC BILATERAL MAMMOGRAM WITH CAD AND TOMO
ULTRASOUND BILATERAL BREAST

[Series 1: us breast*left* limited inc axilla · 0.06mm/px · 5 of 5 slices shown]
[im 1/5]
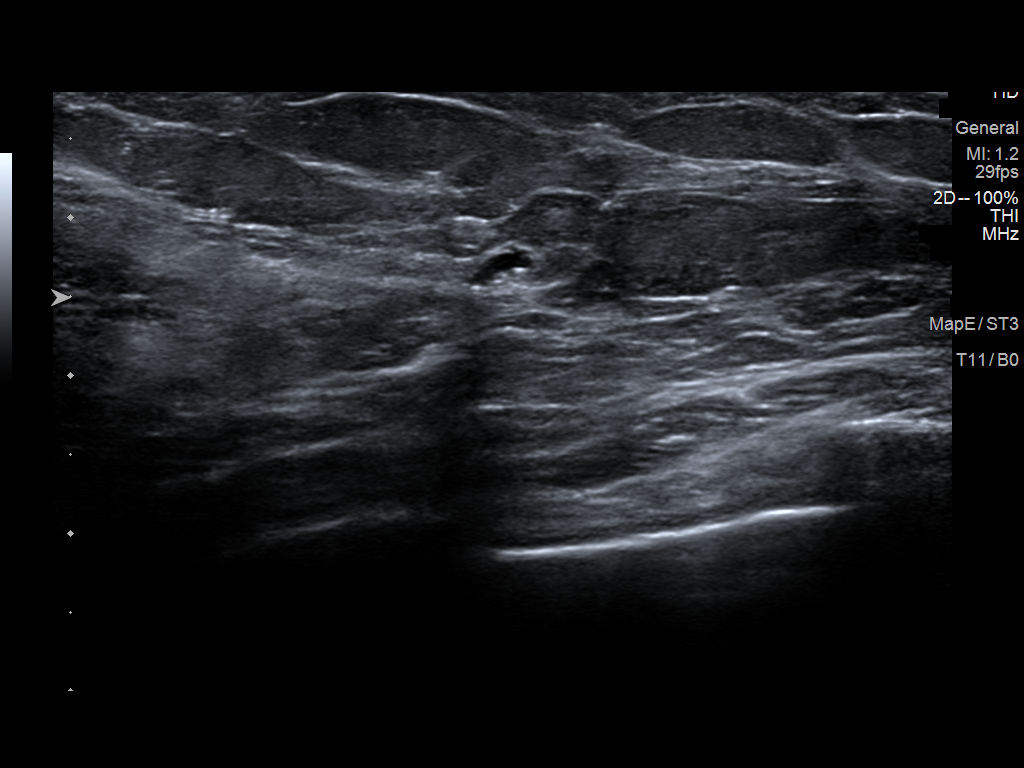
[im 2/5]
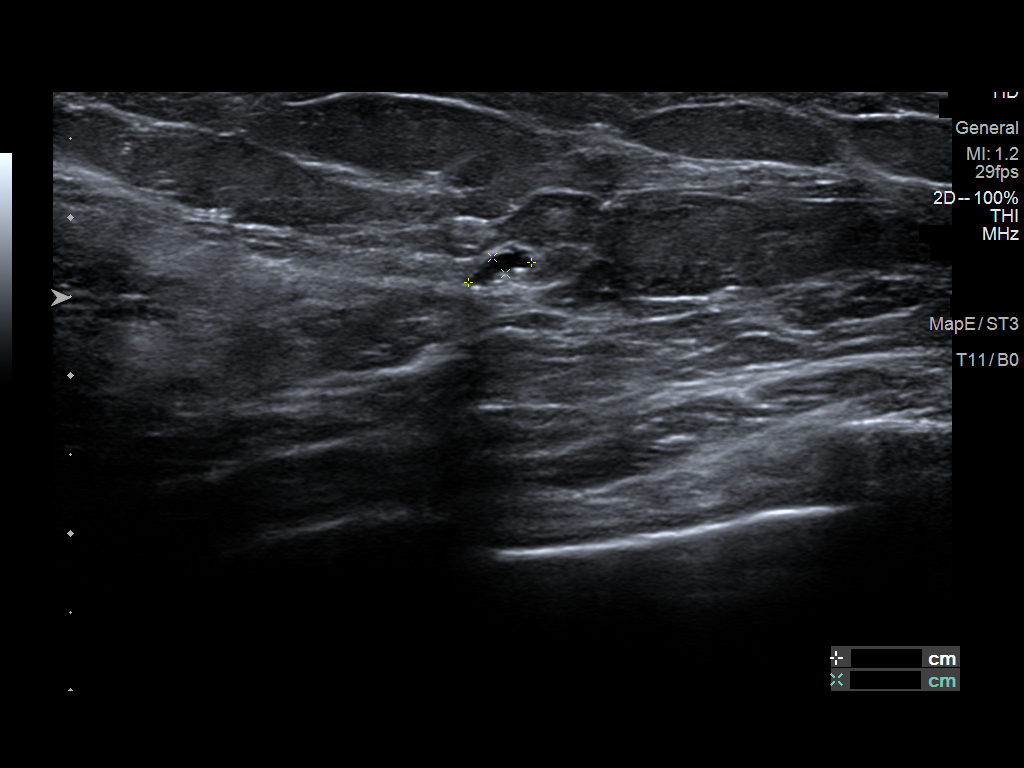
[im 3/5]
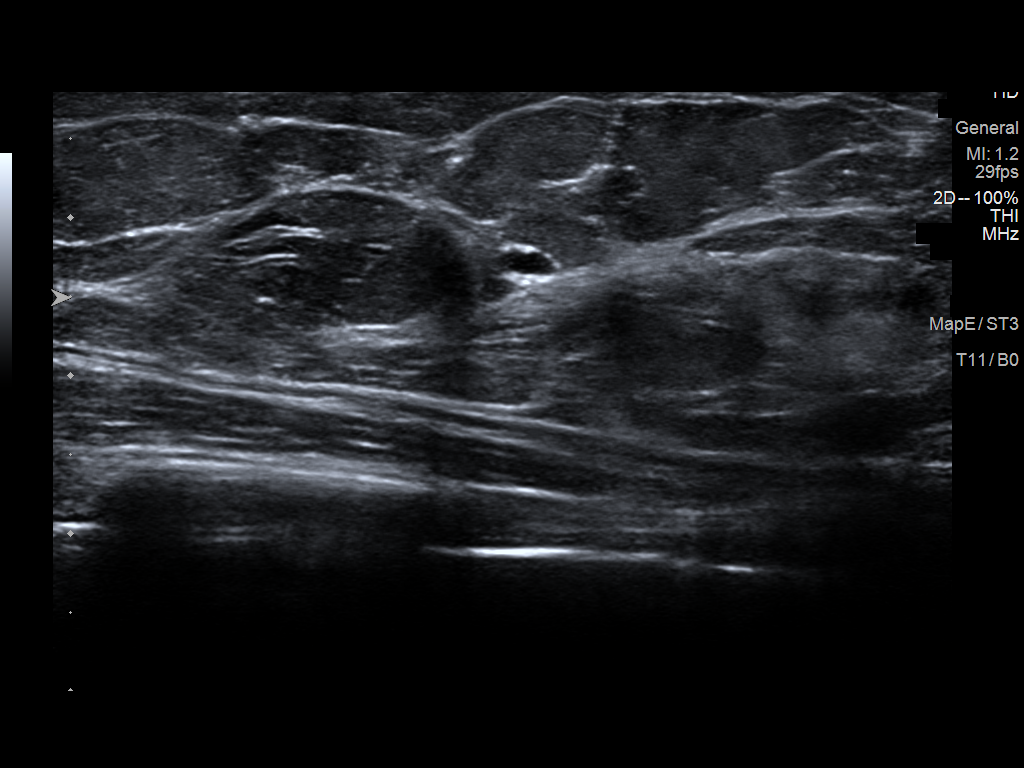
[im 4/5]
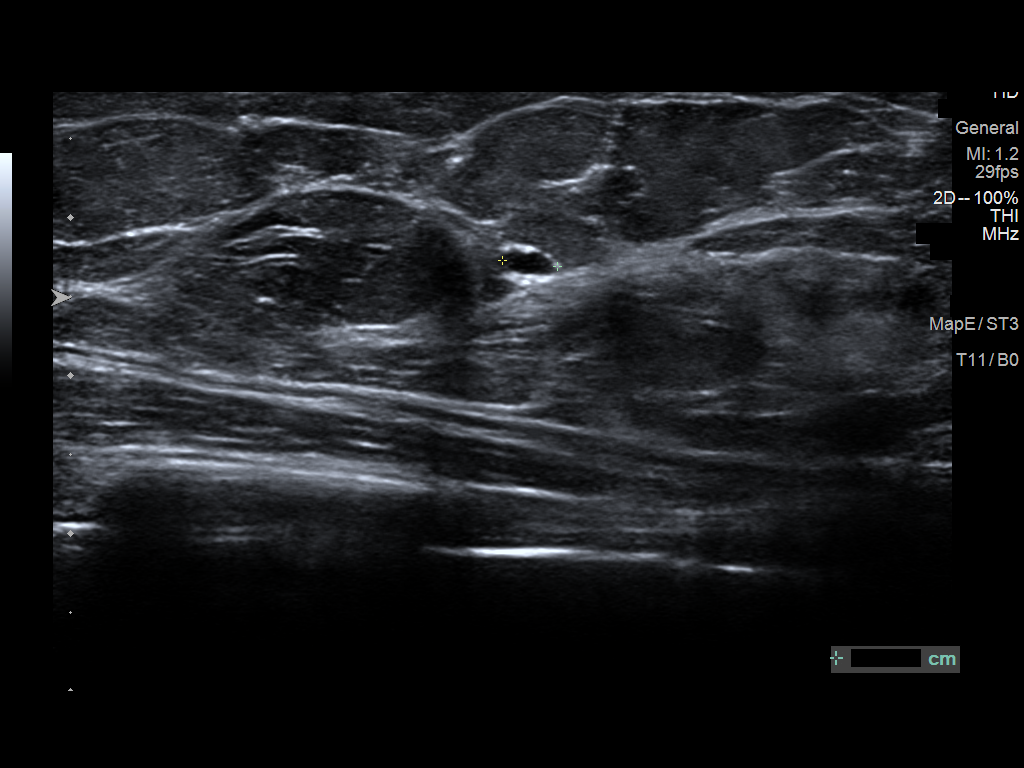
[im 5/5]
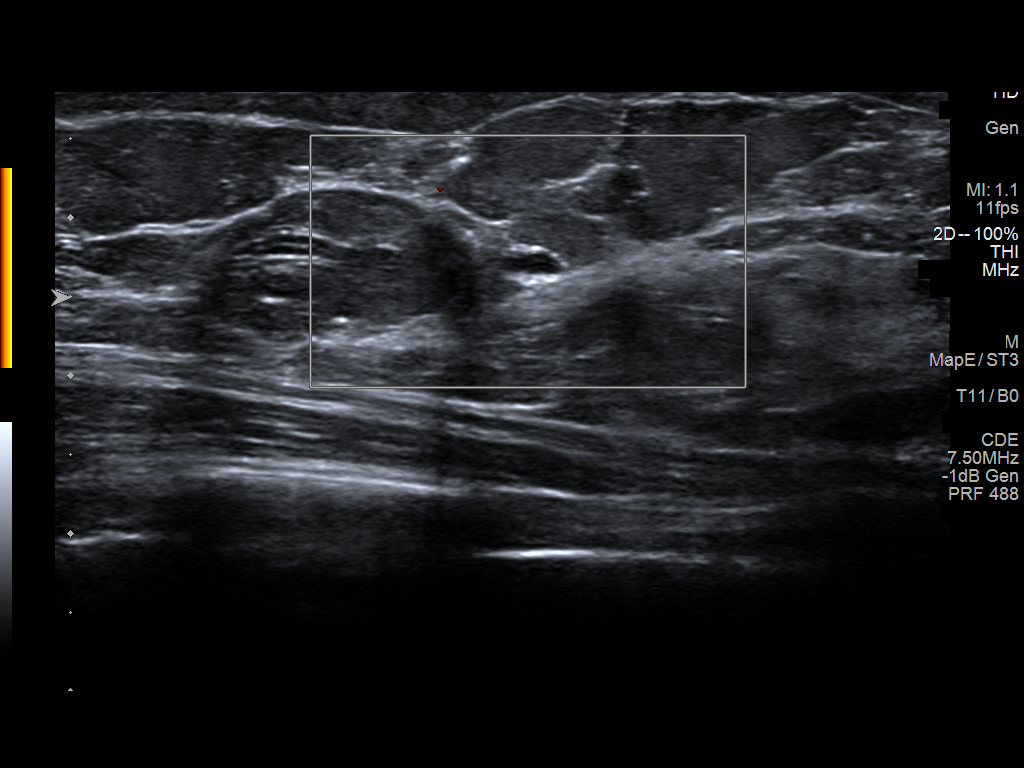

[5 of 5 positions shown; findings below may reference images not displayed]

ACR Breast Density Category c: The breast tissue is heterogeneously
dense, which may obscure small masses.
FINDINGS: Additional imaging of both breast was performed. The asymmetry in
the lateral aspect the left breast does not persist on the
additional views. The asymmetry in the lateral aspect of the right
breast persist on the additional imaging. There are no malignant
type microcalcifications.

Mammographic images were processed with CAD.

Targeted ultrasound is performed, showing a benign cyst in the left
breast at 3 o'clock 7 cm from the nipple measuring 4 x 2 x 4 mm. No
solid mass or abnormal shadowing is detected in the left breast.

Sonographic evaluation of the right breast shows hypoechoic mass at
9 o'clock 4 cm from the nipple measuring 7 x 3 x 7 mm. Sonographic
evaluation of the right axilla does not show any enlarged
adenopathy.
IMPRESSION: Indeterminate mass in the 9 o'clock region of the right breast 4 cm
from the nipple.

RECOMMENDATION:
Ultrasound-guided core biopsy of the mass in the 9 o'clock region of
the right breast is recommended. Biopsy will be scheduled at the
patient's convenience.

I have discussed the findings and recommendations with the patient.
If applicable, a reminder letter will be sent to the patient
regarding the next appointment.

BI-RADS CATEGORY  4: Suspicious.

## 2022-03-08 IMAGING — US US BREAST*R* LIMITED INC AXILLA
2 series · 8 of 8 positions shown · non-contrast
Comparison: Previous exam(s).

CLINICAL DATA: Patient was recalled from screening mammogram for
possible asymmetries in both breast.

EXAM:
DIGITAL DIAGNOSTIC BILATERAL MAMMOGRAM WITH CAD AND TOMO
ULTRASOUND BILATERAL BREAST

[Series 1: us breast*right* limited inc axilla · 0.05mm/px · 5 of 5 slices shown (1 of 2)]
[im 1/5]
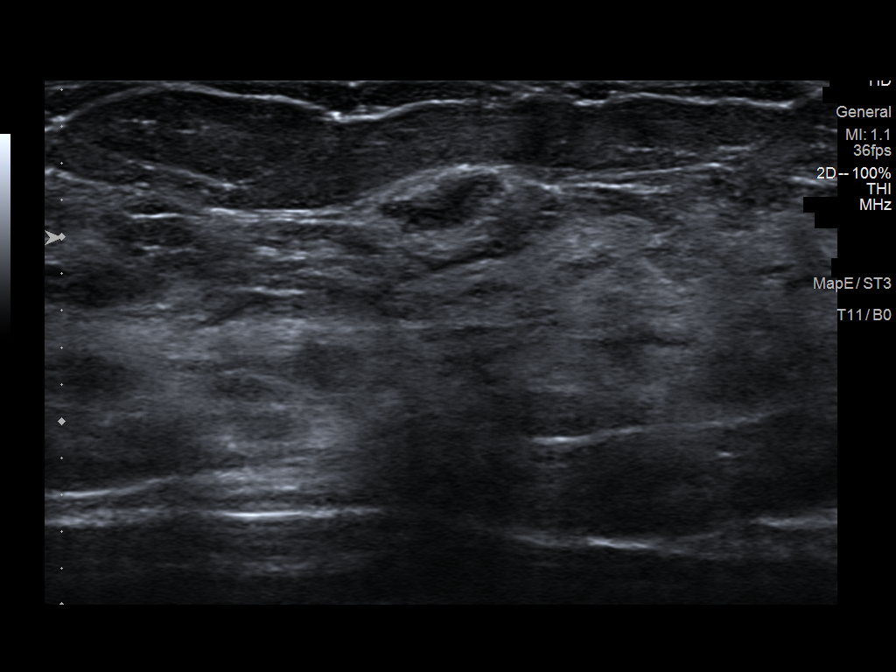
[im 2/5]
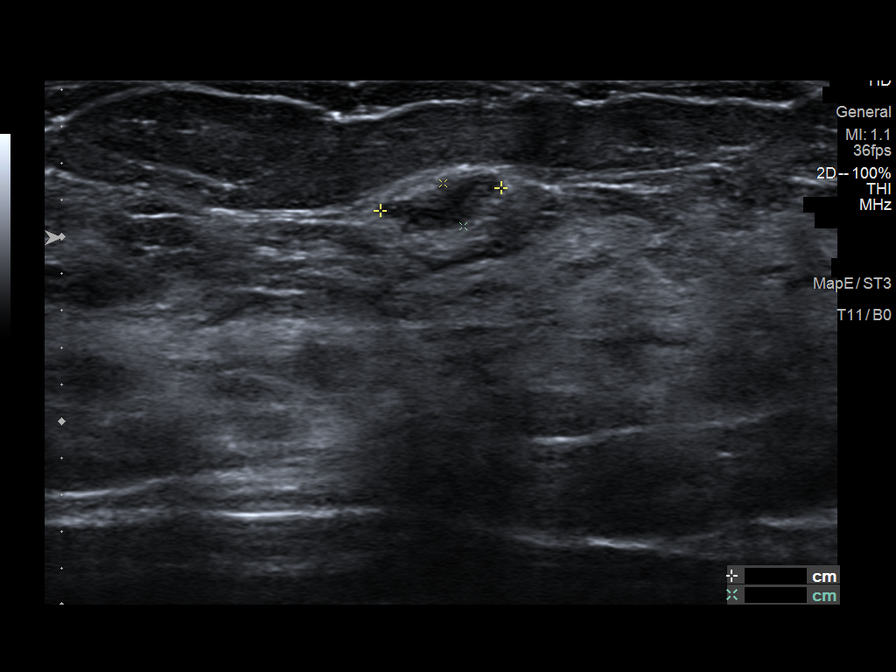
[im 3/5]
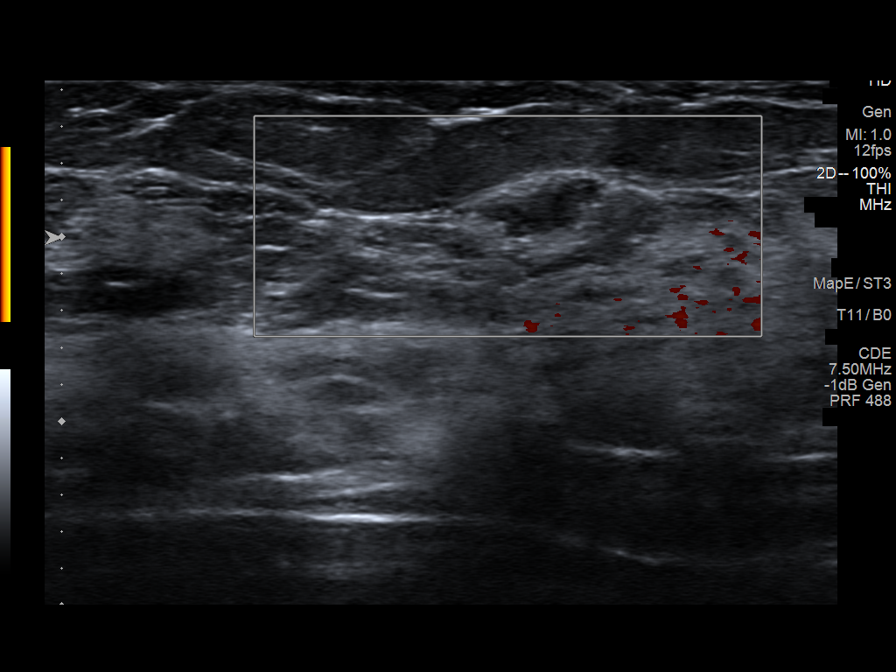
[im 4/5]
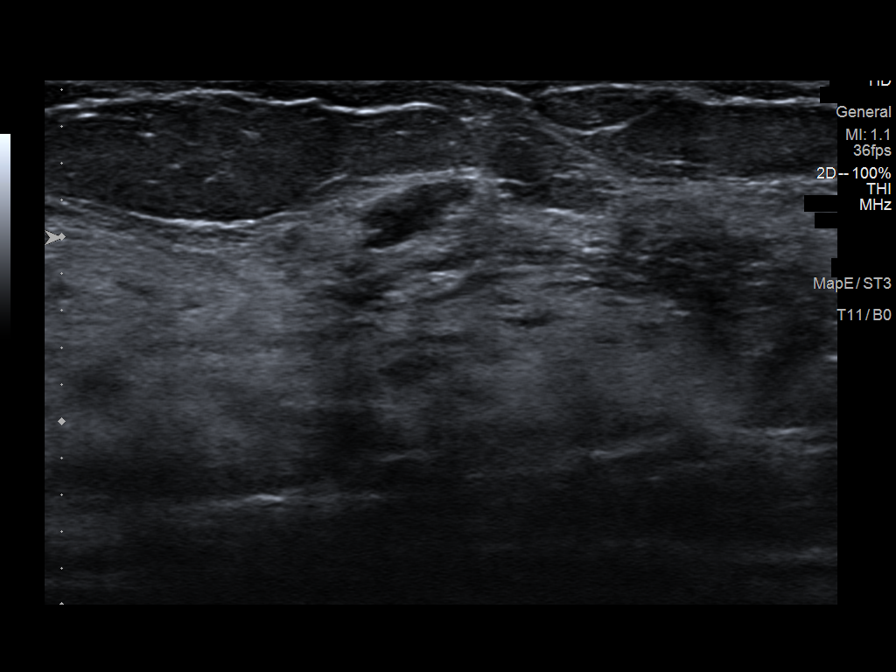
[im 5/5]
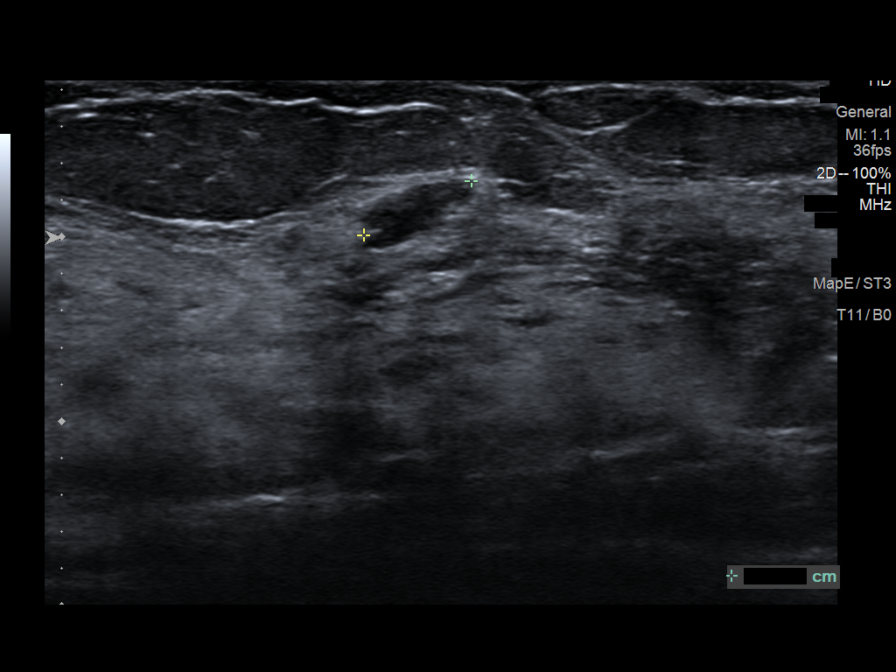

[Series 2: us breast*right* limited inc axilla · 0.07mm/px · 3 of 3 slices shown (2 of 2)]
[im 1/3]
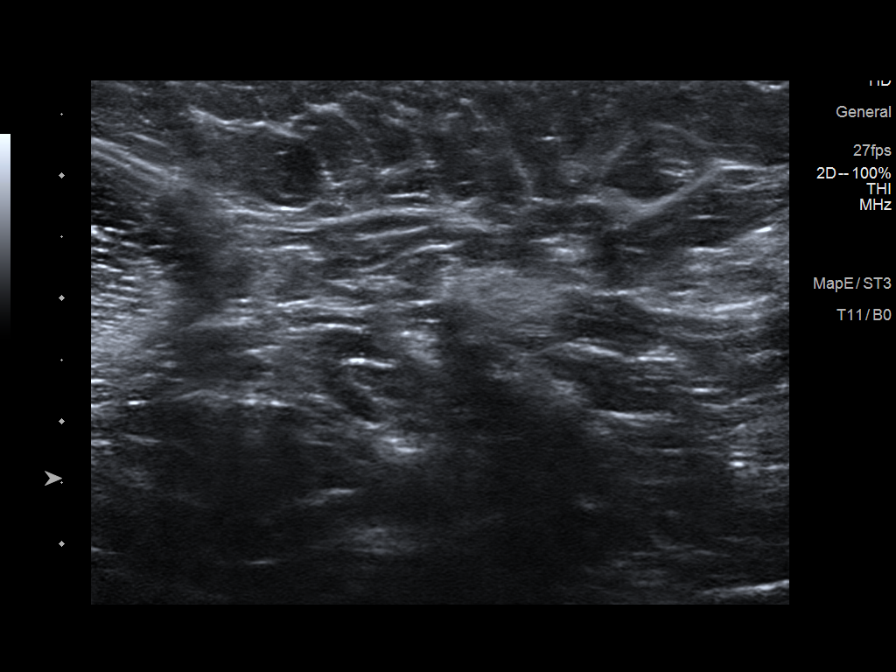
[im 2/3]
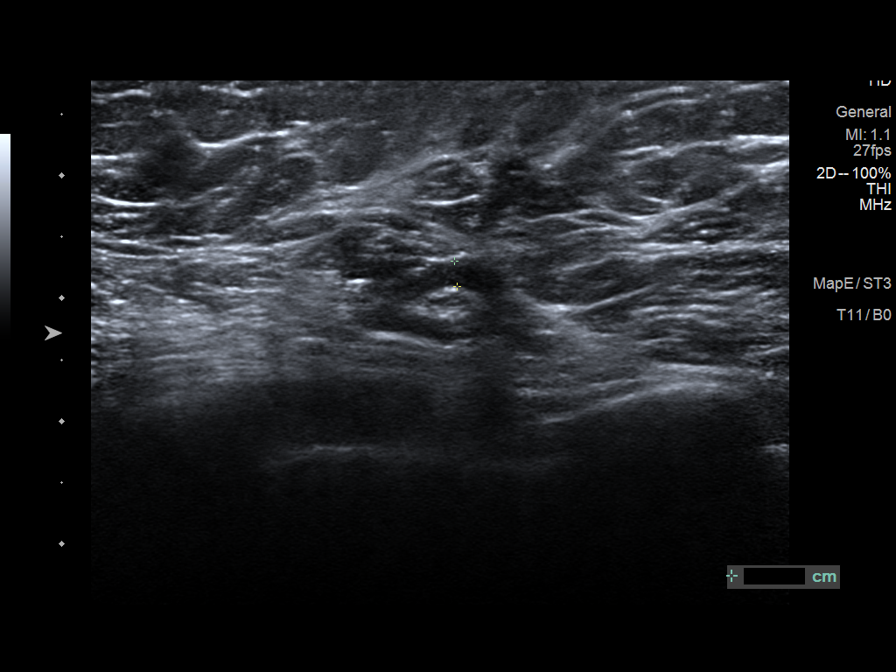
[im 3/3]
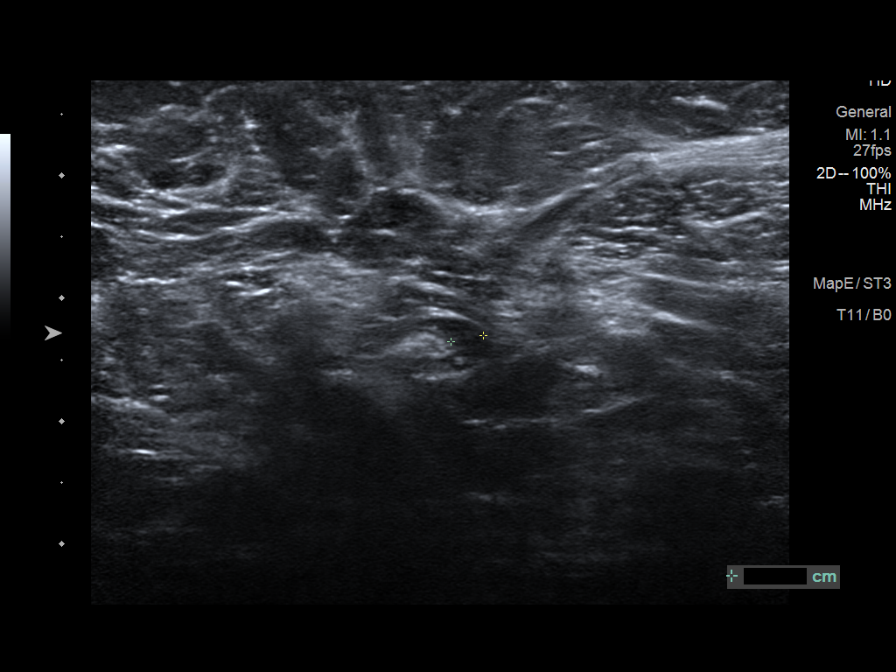

[8 of 8 positions shown; findings below may reference images not displayed]

ACR Breast Density Category c: The breast tissue is heterogeneously
dense, which may obscure small masses.
FINDINGS: Additional imaging of both breast was performed. The asymmetry in
the lateral aspect the left breast does not persist on the
additional views. The asymmetry in the lateral aspect of the right
breast persist on the additional imaging. There are no malignant
type microcalcifications.

Mammographic images were processed with CAD.

Targeted ultrasound is performed, showing a benign cyst in the left
breast at 3 o'clock 7 cm from the nipple measuring 4 x 2 x 4 mm. No
solid mass or abnormal shadowing is detected in the left breast.

Sonographic evaluation of the right breast shows hypoechoic mass at
9 o'clock 4 cm from the nipple measuring 7 x 3 x 7 mm. Sonographic
evaluation of the right axilla does not show any enlarged
adenopathy.
IMPRESSION: Indeterminate mass in the 9 o'clock region of the right breast 4 cm
from the nipple.

RECOMMENDATION:
Ultrasound-guided core biopsy of the mass in the 9 o'clock region of
the right breast is recommended. Biopsy will be scheduled at the
patient's convenience.

I have discussed the findings and recommendations with the patient.
If applicable, a reminder letter will be sent to the patient
regarding the next appointment.

BI-RADS CATEGORY  4: Suspicious.

## 2022-03-21 IMAGING — MG MM BREAST LOCALIZATION CLIP
4 series · 4 of 12 positions shown · non-contrast
Comparison: Previous exam(s).

CLINICAL DATA: Status post ultrasound-guided core biopsy of RIGHT
breast mass.

EXAM:
DIAGNOSTIC RIGHT MAMMOGRAM POST ULTRASOUND BIOPSY

[R CC synth-2D]
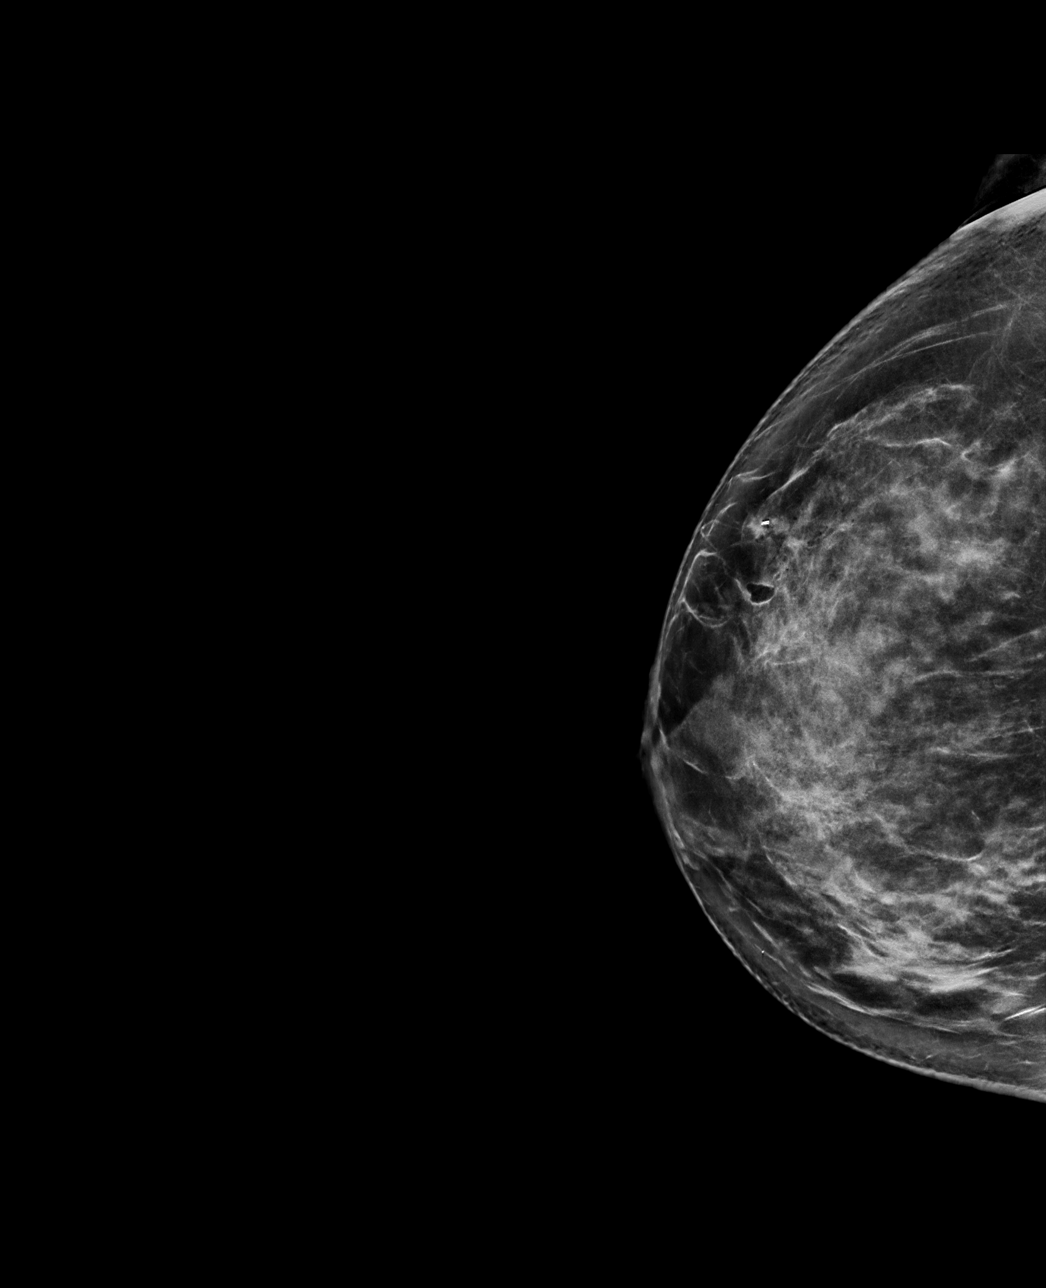

[R ML synth-2D]
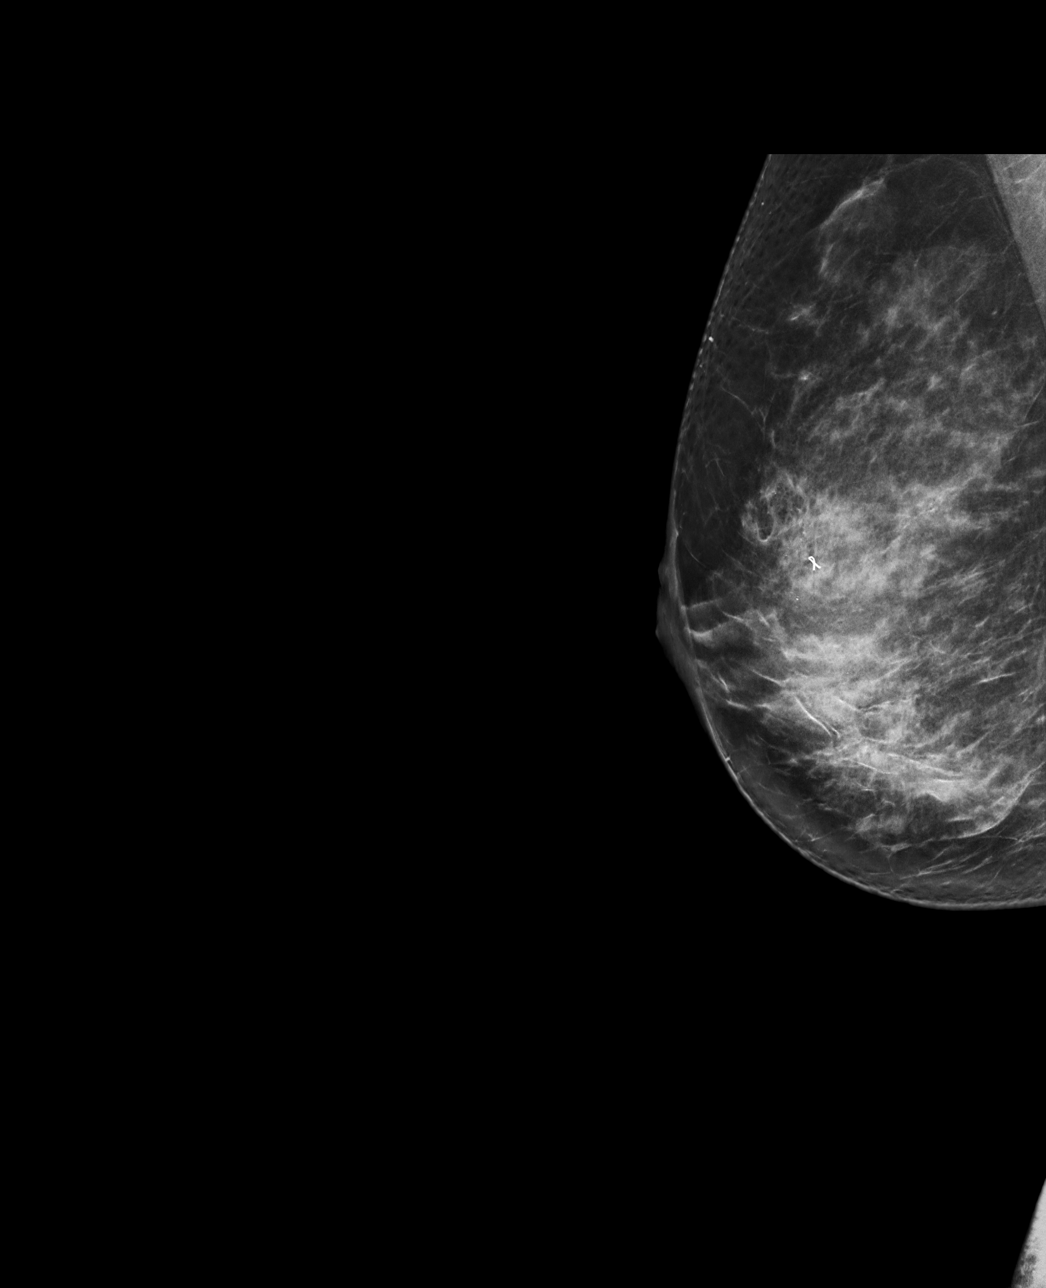

[R ML tomo · tomo slice 42/83.0]
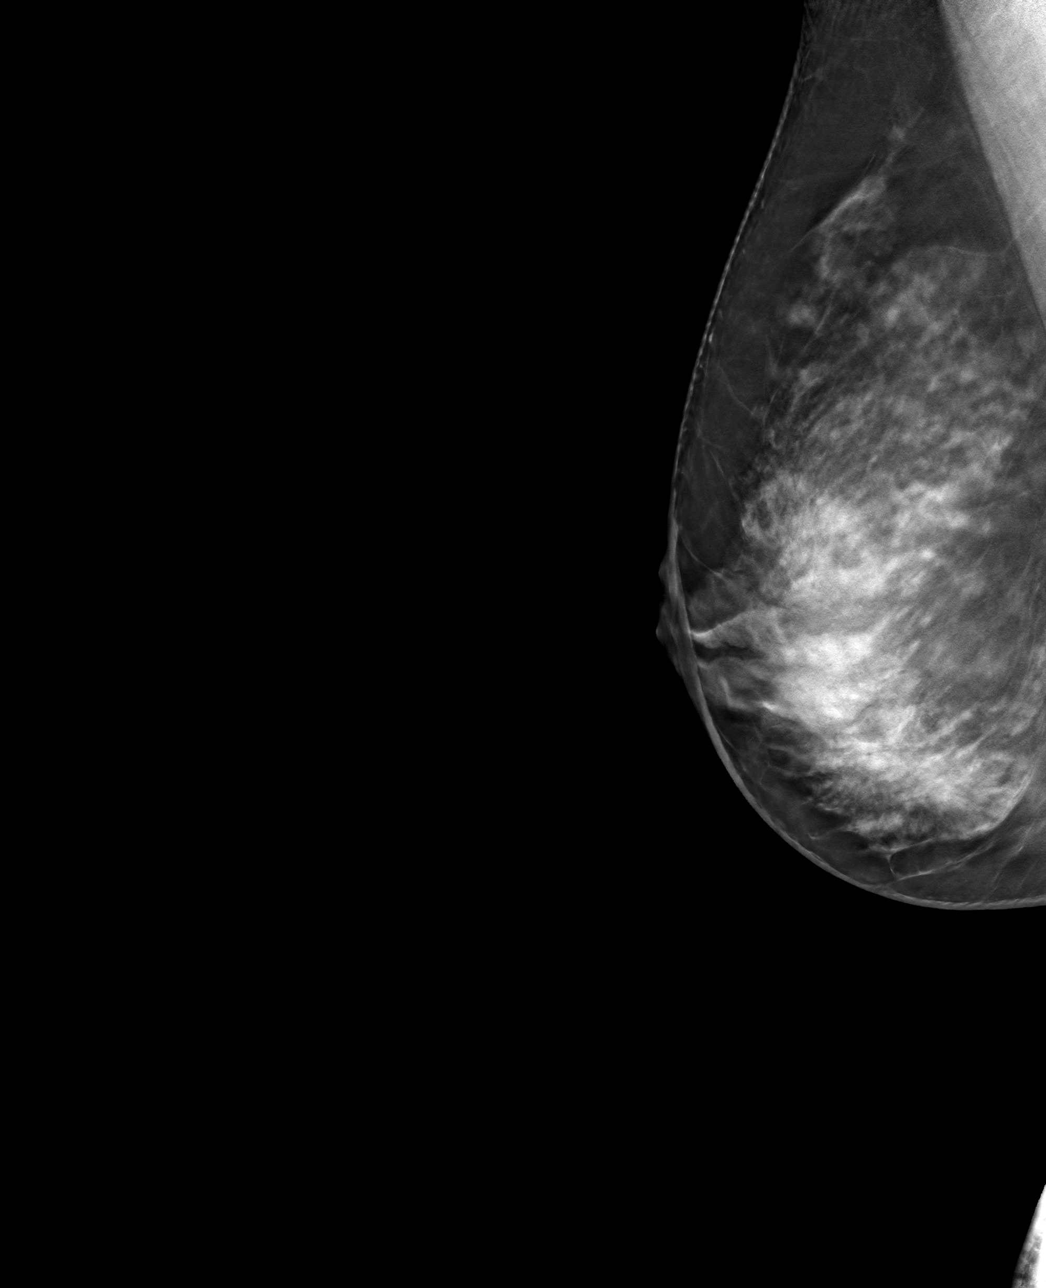

[R CC tomo · tomo slice 41/81.0]
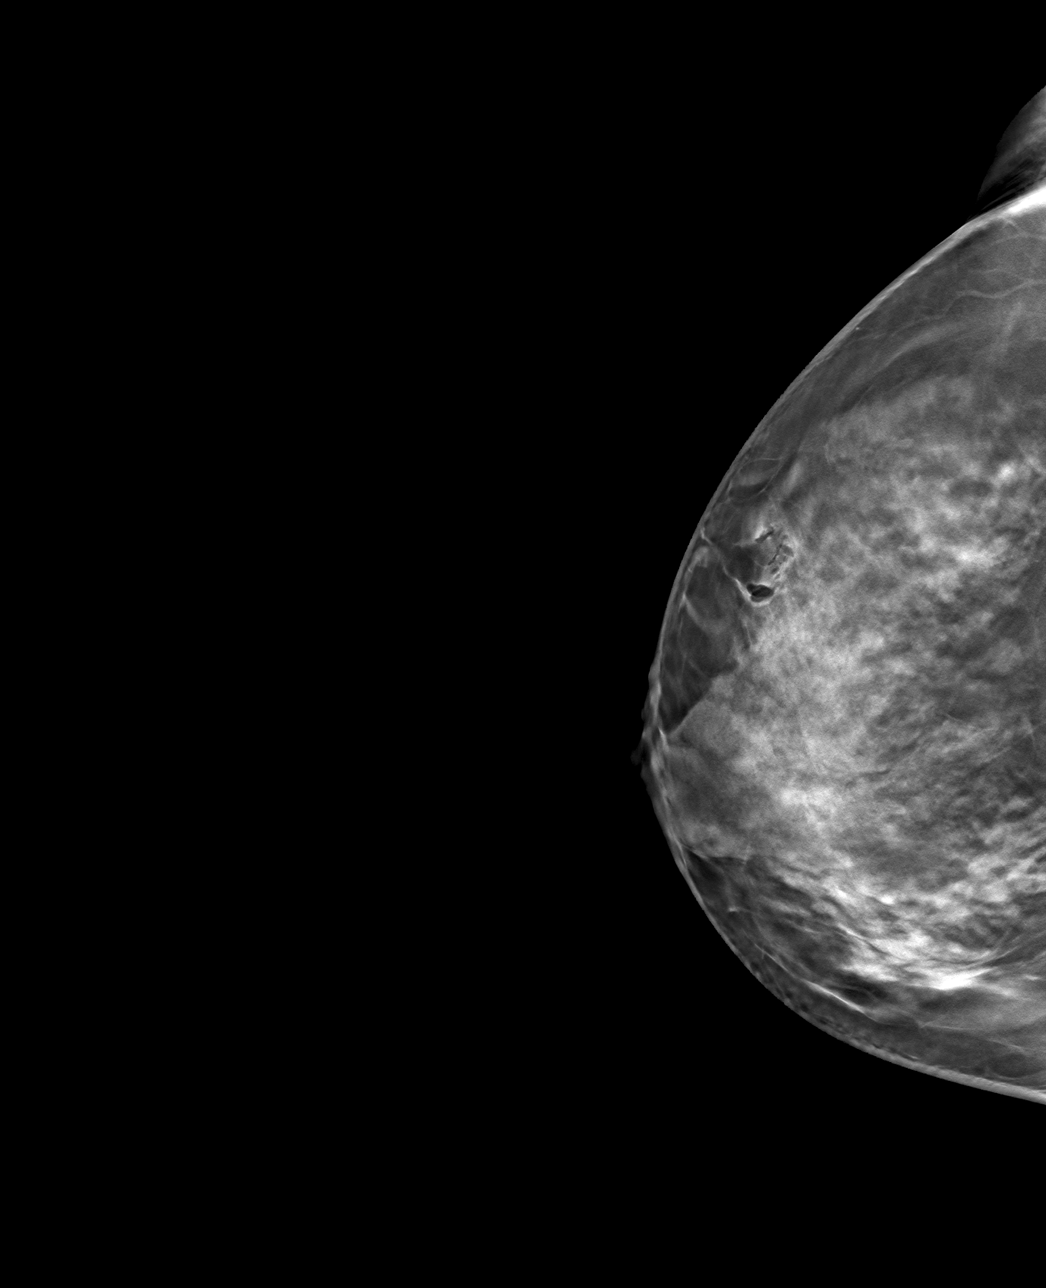

[4 of 12 positions shown; findings below may reference images not displayed]

FINDINGS: Mammographic images were obtained following ultrasound guided biopsy
of mass in the right breast and placement of ribbon shaped clip. The
biopsy marking clip is in expected position at the site of biopsy.
IMPRESSION: Appropriate positioning of the ribbon shaped biopsy marking clip at
the site of biopsy in the 9 o'clock location of the RIGHT breast.

Final Assessment: Post Procedure Mammograms for Marker Placement

## 2022-04-08 DIAGNOSIS — E559 Vitamin D deficiency, unspecified: Secondary | ICD-10-CM | POA: Diagnosis not present

## 2022-04-08 DIAGNOSIS — F411 Generalized anxiety disorder: Secondary | ICD-10-CM | POA: Diagnosis not present

## 2022-04-08 DIAGNOSIS — F331 Major depressive disorder, recurrent, moderate: Secondary | ICD-10-CM | POA: Diagnosis not present

## 2022-04-08 DIAGNOSIS — G4700 Insomnia, unspecified: Secondary | ICD-10-CM | POA: Diagnosis not present

## 2022-05-20 DIAGNOSIS — Z114 Encounter for screening for human immunodeficiency virus [HIV]: Secondary | ICD-10-CM | POA: Diagnosis not present

## 2022-05-20 DIAGNOSIS — E559 Vitamin D deficiency, unspecified: Secondary | ICD-10-CM | POA: Diagnosis not present

## 2022-05-20 DIAGNOSIS — Z0000 Encounter for general adult medical examination without abnormal findings: Secondary | ICD-10-CM | POA: Diagnosis not present

## 2022-05-20 DIAGNOSIS — E782 Mixed hyperlipidemia: Secondary | ICD-10-CM | POA: Diagnosis not present

## 2022-05-26 DIAGNOSIS — F331 Major depressive disorder, recurrent, moderate: Secondary | ICD-10-CM | POA: Diagnosis not present

## 2022-05-26 DIAGNOSIS — G4700 Insomnia, unspecified: Secondary | ICD-10-CM | POA: Diagnosis not present

## 2022-05-26 DIAGNOSIS — F411 Generalized anxiety disorder: Secondary | ICD-10-CM | POA: Diagnosis not present

## 2022-05-26 DIAGNOSIS — E559 Vitamin D deficiency, unspecified: Secondary | ICD-10-CM | POA: Diagnosis not present

## 2022-06-23 DIAGNOSIS — Z0000 Encounter for general adult medical examination without abnormal findings: Secondary | ICD-10-CM | POA: Diagnosis not present

## 2022-06-23 DIAGNOSIS — Z23 Encounter for immunization: Secondary | ICD-10-CM | POA: Diagnosis not present

## 2023-06-06 DIAGNOSIS — G4700 Insomnia, unspecified: Secondary | ICD-10-CM | POA: Diagnosis not present

## 2023-06-06 DIAGNOSIS — F331 Major depressive disorder, recurrent, moderate: Secondary | ICD-10-CM | POA: Diagnosis not present

## 2023-06-06 DIAGNOSIS — F411 Generalized anxiety disorder: Secondary | ICD-10-CM | POA: Diagnosis not present

## 2023-06-21 DIAGNOSIS — Z13220 Encounter for screening for lipoid disorders: Secondary | ICD-10-CM | POA: Diagnosis not present

## 2023-06-21 DIAGNOSIS — Z114 Encounter for screening for human immunodeficiency virus [HIV]: Secondary | ICD-10-CM | POA: Diagnosis not present

## 2023-06-21 DIAGNOSIS — Z0000 Encounter for general adult medical examination without abnormal findings: Secondary | ICD-10-CM | POA: Diagnosis not present

## 2023-06-28 DIAGNOSIS — Z0000 Encounter for general adult medical examination without abnormal findings: Secondary | ICD-10-CM | POA: Diagnosis not present

## 2023-06-28 DIAGNOSIS — Z23 Encounter for immunization: Secondary | ICD-10-CM | POA: Diagnosis not present

## 2023-08-16 DIAGNOSIS — J069 Acute upper respiratory infection, unspecified: Secondary | ICD-10-CM | POA: Diagnosis not present

## 2023-08-16 DIAGNOSIS — B9689 Other specified bacterial agents as the cause of diseases classified elsewhere: Secondary | ICD-10-CM | POA: Diagnosis not present

## 2023-08-16 DIAGNOSIS — J038 Acute tonsillitis due to other specified organisms: Secondary | ICD-10-CM | POA: Diagnosis not present

## 2023-08-23 DIAGNOSIS — Z01419 Encounter for gynecological examination (general) (routine) without abnormal findings: Secondary | ICD-10-CM | POA: Diagnosis not present

## 2023-08-23 DIAGNOSIS — Z1231 Encounter for screening mammogram for malignant neoplasm of breast: Secondary | ICD-10-CM | POA: Diagnosis not present

## 2023-08-23 DIAGNOSIS — Z6828 Body mass index (BMI) 28.0-28.9, adult: Secondary | ICD-10-CM | POA: Diagnosis not present

## 2023-08-23 DIAGNOSIS — Z1151 Encounter for screening for human papillomavirus (HPV): Secondary | ICD-10-CM | POA: Diagnosis not present

## 2023-08-23 DIAGNOSIS — Z124 Encounter for screening for malignant neoplasm of cervix: Secondary | ICD-10-CM | POA: Diagnosis not present

## 2023-08-30 DIAGNOSIS — F32A Depression, unspecified: Secondary | ICD-10-CM | POA: Diagnosis not present

## 2023-08-30 DIAGNOSIS — Z1211 Encounter for screening for malignant neoplasm of colon: Secondary | ICD-10-CM | POA: Diagnosis not present

## 2023-08-30 DIAGNOSIS — E669 Obesity, unspecified: Secondary | ICD-10-CM | POA: Diagnosis not present

## 2024-04-26 DIAGNOSIS — Z30433 Encounter for removal and reinsertion of intrauterine contraceptive device: Secondary | ICD-10-CM | POA: Diagnosis not present

## 2024-06-29 DIAGNOSIS — G4700 Insomnia, unspecified: Secondary | ICD-10-CM | POA: Diagnosis not present

## 2024-06-29 DIAGNOSIS — R5383 Other fatigue: Secondary | ICD-10-CM | POA: Diagnosis not present

## 2024-06-29 DIAGNOSIS — E559 Vitamin D deficiency, unspecified: Secondary | ICD-10-CM | POA: Diagnosis not present

## 2024-06-29 DIAGNOSIS — F411 Generalized anxiety disorder: Secondary | ICD-10-CM | POA: Diagnosis not present
